# Patient Record
Sex: Male | Born: 1985 | Race: Black or African American | Hispanic: No | Marital: Single | Smoking: Current every day smoker
Health system: Southern US, Community
[De-identification: ages and names within clinical notes are randomized; demographics above are authoritative.]

## PROBLEM LIST (undated history)

## (undated) ENCOUNTER — Emergency Department: Discharge status: Absent without leave | Payer: Self-pay

## (undated) DIAGNOSIS — B20 Human immunodeficiency virus [HIV] disease: Secondary | ICD-10-CM

## (undated) DIAGNOSIS — Z21 Asymptomatic human immunodeficiency virus [HIV] infection status: Secondary | ICD-10-CM

## (undated) HISTORY — PX: OTHER SURGICAL HISTORY: SHX169

---

## 2011-06-28 ENCOUNTER — Emergency Department (HOSPITAL_COMMUNITY): Payer: Self-pay

## 2011-06-28 ENCOUNTER — Inpatient Hospital Stay (HOSPITAL_COMMUNITY)
Admission: EM | Admit: 2011-06-28 | Discharge: 2011-06-30 | DRG: 392 | Disposition: A | Discharge status: Home / Self Care | Payer: Self-pay | Attending: Internal Medicine | Admitting: Internal Medicine

## 2011-06-28 DIAGNOSIS — K921 Melena: Secondary | ICD-10-CM | POA: Diagnosis present

## 2011-06-28 DIAGNOSIS — Z21 Asymptomatic human immunodeficiency virus [HIV] infection status: Secondary | ICD-10-CM | POA: Diagnosis present

## 2011-06-28 DIAGNOSIS — R109 Unspecified abdominal pain: Principal | ICD-10-CM | POA: Diagnosis present

## 2011-06-28 DIAGNOSIS — F17200 Nicotine dependence, unspecified, uncomplicated: Secondary | ICD-10-CM | POA: Diagnosis present

## 2011-06-28 LAB — CBC
Hemoglobin: 16.2 g/dL (ref 13.0–17.0)
MCHC: 35.8 g/dL (ref 30.0–36.0)
Platelets: 268 10*3/uL (ref 150–400)

## 2011-06-28 LAB — URINALYSIS, ROUTINE W REFLEX MICROSCOPIC
Glucose, UA: NEGATIVE mg/dL
Leukocytes, UA: NEGATIVE
Nitrite: NEGATIVE
Protein, ur: NEGATIVE mg/dL
Urobilinogen, UA: 1 mg/dL (ref 0.0–1.0)

## 2011-06-28 LAB — DIFFERENTIAL
Basophils Absolute: 0 10*3/uL (ref 0.0–0.1)
Basophils Relative: 0 % (ref 0–1)
Eosinophils Absolute: 0.1 10*3/uL (ref 0.0–0.7)
Monocytes Absolute: 0.4 10*3/uL (ref 0.1–1.0)
Neutro Abs: 5 10*3/uL (ref 1.7–7.7)
Neutrophils Relative %: 69 % (ref 43–77)

## 2011-06-28 LAB — COMPREHENSIVE METABOLIC PANEL
ALT: 17 U/L (ref 0–53)
AST: 18 U/L (ref 0–37)
Albumin: 4.3 g/dL (ref 3.5–5.2)
CO2: 33 mEq/L — ABNORMAL HIGH (ref 19–32)
Calcium: 10.4 mg/dL (ref 8.4–10.5)
Creatinine, Ser: 0.84 mg/dL (ref 0.50–1.35)
Sodium: 141 mEq/L (ref 135–145)
Total Protein: 8.3 g/dL (ref 6.0–8.3)

## 2011-06-29 ENCOUNTER — Encounter (HOSPITAL_COMMUNITY): Payer: Self-pay | Admitting: Radiology

## 2011-06-29 ENCOUNTER — Observation Stay (HOSPITAL_COMMUNITY): Payer: Self-pay

## 2011-06-29 LAB — BASIC METABOLIC PANEL
BUN: 6 mg/dL (ref 6–23)
CO2: 27 mEq/L (ref 19–32)
Chloride: 105 mEq/L (ref 96–112)
Creatinine, Ser: 0.76 mg/dL (ref 0.50–1.35)
Potassium: 3.7 mEq/L (ref 3.5–5.1)

## 2011-06-29 LAB — SEDIMENTATION RATE: Sed Rate: 2 mm/hr (ref 0–16)

## 2011-06-29 LAB — T-HELPER CELLS (CD4) COUNT (NOT AT ARMC)
CD4 % Helper T Cell: 31 % — ABNORMAL LOW (ref 33–55)
CD4 T Cell Abs: 620 uL (ref 400–2700)

## 2011-06-29 LAB — CBC
HCT: 39.8 % (ref 39.0–52.0)
Hemoglobin: 14.2 g/dL (ref 13.0–17.0)
MCH: 32.6 pg (ref 26.0–34.0)
MCV: 91.3 fL (ref 78.0–100.0)
RBC: 4.36 MIL/uL (ref 4.22–5.81)
WBC: 5.5 10*3/uL (ref 4.0–10.5)

## 2011-06-29 MED ORDER — IOHEXOL 300 MG/ML  SOLN
100.0000 mL | Freq: Once | INTRAMUSCULAR | Status: AC | PRN
  Administered 2011-06-29: 100 mL via INTRAVENOUS

## 2011-06-30 LAB — GC/CHLAMYDIA PROBE AMP, URINE: Chlamydia, Swab/Urine, PCR: NEGATIVE

## 2011-07-01 LAB — HIV-1 RNA QUANT-NO REFLEX-BLD: HIV 1 RNA Quant: 168 copies/mL — ABNORMAL HIGH (ref <20)

## 2011-07-13 NOTE — Discharge Summary (Signed)
  Paul Palmer, Paul Palmer           ACCOUNT NO.:  [id number]  MEDICAL RECORD NO.:  [id number]  LOCATION:  1337                         FACILITY:  [facility]  PHYSICIAN:  Kathlen Mody, MD       DATE OF BIRTH:  1986/05/22  DATE OF ADMISSION:  06/28/2011 DATE OF DISCHARGE:  06/30/2011                              DISCHARGE SUMMARY   DISCHARGE DIAGNOSES: 1. Human immunodeficiency virus. 2. Nausea, abdominal pain. 3. Tobacco abuse.  DISCHARGE MEDICATIONS:  Nicotine patch.  CONSULTATIONS:  Phone consult from Dr. Lucianne Muss from Infectious Disease.  PERTINENT LABS:  The patient had a CBC done which was within normal limits.  Urinalysis is negative for nitrites and leukocytes. Comprehensive metabolic, significant for bicarb of 33, lipase within normal limits, amylase within normal limits.  ESR was 2.  CD-4 count was 620.  T-helper percentage was 31.  Chlamydia and GC probe was negative. HIV RNA was 168 and C-reactive protein was more than 0.6. DIAGNOSTIC STUDIES:  The patient had a CT abdomen and pelvis done which showed a normal appendix, tiny amount of perihepatic ascites, very small amount of ascites in the pelvis with the exact etiology not identified, negative for any free intraperitoneal air.  BRIEF HOSPITAL COURSE:  This 25 year old gentleman who was recently transferred to [facility], has a history of HIV, has not been on treatment for more than 6 months was initially being treated at [place].  He was admitted for abdominal pain associated with some nausea, vomiting, and diarrhea.  He was given IV fluids and IV Zofran for his nausea.  Over the course of the next 24 hours, his symptoms have improved. 1. HIV:  The patient has not been on a HAART therapy for more than six     months as he has recently transferred to [facility].  Infectious     Disease consult was obtained over the phone who recommended to be     discharged home and to follow up with ID Clinic the next day.  Infectious Disease office was called and a follow up appointment     was scheduled for the next day. 2. Tobacco abuse:  Tobacco cessation counseling given and nicotine     patch prescription was given to him.  On the day of discharge, the     patient's vitals were within normal limits.  His exam was within     normal limits.  He was discharged home to follow up with ID Clinic     for his HIV.         ______________________________ Kathlen Mody, MD    VA/MEDQ  D:  07/12/2011  T:  07/13/2011  Job:  [id number]  Electronically Signed by Kathlen Mody MD on 07/13/2011 02:07:39 PM

## 2011-07-23 ENCOUNTER — Ambulatory Visit (INDEPENDENT_AMBULATORY_CARE_PROVIDER_SITE_OTHER): Payer: Self-pay | Admitting: Internal Medicine

## 2011-07-23 ENCOUNTER — Encounter: Payer: Self-pay | Admitting: Internal Medicine

## 2011-07-23 VITALS — BP 118/76 | HR 76 | Temp 98.2°F | Ht 74.0 in | Wt 171.0 lb

## 2011-07-23 DIAGNOSIS — Z113 Encounter for screening for infections with a predominantly sexual mode of transmission: Secondary | ICD-10-CM

## 2011-07-23 DIAGNOSIS — B20 Human immunodeficiency virus [HIV] disease: Secondary | ICD-10-CM | POA: Insufficient documentation

## 2011-07-23 MED ORDER — EFAVIRENZ-EMTRICITAB-TENOFOVIR 600-200-300 MG PO TABS: 1.0000 | ORAL_TABLET | Freq: Every day | ORAL | Status: DC

## 2011-07-23 NOTE — Progress Notes (Signed)
  Subjective:    Patient ID: Paul Palmer, male    DOB: 08-31-1986, 25 y.o.   MRN: [id number]  HPI this is a new patient here to our clinic for treatment of HIV. He previously was treated in [place] through their [facility] [facility] clinic and was started approximately one year ago on Atripla. He states that he took the medicine well not missing any doses during that time however he had run out of medicine after moving to [facility] and had not looked into continuing the medication through a local clinic until he had already run out. Unfortunately therefore he has been out of his medicines since July. She was recently hospitalized briefly for4 gastrointestinal symptoms that have since resolved. His CD4 T-cell count at that time was 620 and his viral load was detectable at 168 copies.  He tells me in the past he has never had any opportunistic infections, no history of gonorrhea, no history of Chlamydia, no history of syphilis. He has never been hospitalized for complications of HIV. He tells me his CD4 nadir was approximately 400. He otherwise has no other medical problems and takes no other medications. The Atripla he took with some occasional fatigue however he felt that the medicine was tolerable and hopes to take that again. He does tell me he's had all of his appropriate vaccines though no records are available yet today.    Review of Systems  All other systems reviewed and are negative.       Objective:   Physical Exam  Constitutional: He is oriented to person, place, and time. He appears well-developed and well-nourished.  HENT:  Mouth/Throat: No oropharyngeal exudate.  Eyes: No scleral icterus.  Cardiovascular: Normal rate, regular rhythm and normal heart sounds.   No murmur heard. Pulmonary/Chest: Effort normal and breath sounds normal. He has no wheezes.  Abdominal: Soft. Bowel sounds are normal. There is no tenderness.  Lymphadenopathy:    He has no cervical adenopathy.    Neurological: He is alert and oriented to person, place, and time.  Skin: Skin is warm and dry. No erythema.  Psychiatric: He has a normal mood and affect. His behavior is normal.          Assessment & Plan:

## 2011-07-23 NOTE — Assessment & Plan Note (Signed)
Today he will be signed up for ADAP and once that is in place he will be restarted on his Atripla. I did discuss with him the concern for resistance particularly since he had previously stopped. I did tell him that with this will be followed closely however he will restart the Atripla and have the viral load and T-cell count checked one month following initiation of therapy. In addition I will get previous records to include recent labs as well as his vaccine history. He did state that he had his flu shot already and all his vaccines are otherwise up to date. He did receive condoms and I did encourage condom use for prevention. I also discussed the long-term effects of HIV on his kidneys and heart and encouraged healthy diet as well as exercise. He does tell me he is very active and takes good care of himself.

## 2011-07-27 ENCOUNTER — Ambulatory Visit: Payer: Self-pay
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
## 2011-11-19 ENCOUNTER — Encounter (HOSPITAL_COMMUNITY): Payer: Self-pay | Admitting: Family Medicine

## 2011-11-19 ENCOUNTER — Emergency Department (HOSPITAL_COMMUNITY): Payer: No Typology Code available for payment source

## 2011-11-19 ENCOUNTER — Emergency Department (HOSPITAL_COMMUNITY)
Admission: EM | Admit: 2011-11-19 | Discharge: 2011-11-19 | Disposition: A | Discharge status: Home / Self Care | Payer: No Typology Code available for payment source | Attending: Emergency Medicine | Admitting: Emergency Medicine

## 2011-11-19 DIAGNOSIS — S139XXA Sprain of joints and ligaments of unspecified parts of neck, initial encounter: Secondary | ICD-10-CM | POA: Insufficient documentation

## 2011-11-19 DIAGNOSIS — Z21 Asymptomatic human immunodeficiency virus [HIV] infection status: Secondary | ICD-10-CM | POA: Insufficient documentation

## 2011-11-19 DIAGNOSIS — R51 Headache: Secondary | ICD-10-CM | POA: Insufficient documentation

## 2011-11-19 DIAGNOSIS — Y92410 Unspecified street and highway as the place of occurrence of the external cause: Secondary | ICD-10-CM | POA: Insufficient documentation

## 2011-11-19 DIAGNOSIS — Y998 Other external cause status: Secondary | ICD-10-CM | POA: Insufficient documentation

## 2011-11-19 DIAGNOSIS — IMO0002 Reserved for concepts with insufficient information to code with codable children: Secondary | ICD-10-CM | POA: Insufficient documentation

## 2011-11-19 DIAGNOSIS — F17200 Nicotine dependence, unspecified, uncomplicated: Secondary | ICD-10-CM | POA: Insufficient documentation

## 2011-11-19 DIAGNOSIS — M542 Cervicalgia: Secondary | ICD-10-CM | POA: Insufficient documentation

## 2011-11-19 DIAGNOSIS — S8000XA Contusion of unspecified knee, initial encounter: Secondary | ICD-10-CM | POA: Insufficient documentation

## 2011-11-19 DIAGNOSIS — Z79899 Other long term (current) drug therapy: Secondary | ICD-10-CM | POA: Insufficient documentation

## 2011-11-19 HISTORY — DX: Human immunodeficiency virus (HIV) disease: B20

## 2011-11-19 HISTORY — DX: Asymptomatic human immunodeficiency virus (hiv) infection status: Z21

## 2011-11-19 MED ORDER — SODIUM CHLORIDE 0.9 % IV SOLN
Freq: Once | INTRAVENOUS | Status: AC
  Administered 2011-11-19: 21:00:00 via INTRAVENOUS

## 2011-11-19 MED ORDER — ONDANSETRON HCL 4 MG/2ML IJ SOLN
4.0000 mg | Freq: Once | INTRAMUSCULAR | Status: AC
  Administered 2011-11-19: 4 mg via INTRAVENOUS
  Filled 2011-11-19: qty 2

## 2011-11-19 MED ORDER — HYDROMORPHONE HCL PF 1 MG/ML IJ SOLN
1.0000 mg | Freq: Once | INTRAMUSCULAR | Status: AC
  Administered 2011-11-19: 1 mg via INTRAVENOUS
  Filled 2011-11-19: qty 1

## 2011-11-19 MED ORDER — HYDROCODONE-ACETAMINOPHEN 5-500 MG PO TABS: 1.0000 | ORAL_TABLET | Freq: Four times a day (QID) | ORAL | Status: AC | PRN

## 2011-11-19 MED ORDER — IBUPROFEN 600 MG PO TABS: 600.0000 mg | ORAL_TABLET | Freq: Four times a day (QID) | ORAL | Status: AC | PRN
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
## 2011-11-19 NOTE — ED Notes (Signed)
Patient transported to X-ray 

## 2011-11-19 NOTE — ED Notes (Signed)
C-collar removed after confirming with Manus Rudd, FNP.  Patient's face cleaned with Safe Cleanse Wound Cleaner.  Patient tolerated well. 2 lacerations, approx 1/2 cm long noted to left cheek; bleeding controlled. Superficial abrasion noted above left eye brow.

## 2011-11-19 NOTE — ED Notes (Signed)
Patient transported to CT 

## 2011-11-19 NOTE — ED Notes (Signed)
Returned from xray

## 2011-11-19 NOTE — ED Notes (Signed)
Gail S, NP at bedside  
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
## 2011-11-20 NOTE — ED Provider Notes (Signed)
Medical screening examination/treatment/procedure(s) were performed by non-physician practitioner and as supervising physician I was immediately available for consultation/collaboration.  Monteen Toops P Aune Adami, MD 11/20/11 2207 

## 2011-11-25 ENCOUNTER — Telehealth: Payer: Self-pay | Admitting: *Deleted
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
## 2011-11-30 ENCOUNTER — Other Ambulatory Visit: Payer: No Typology Code available for payment source

## 2011-11-30 ENCOUNTER — Telehealth: Payer: Self-pay | Admitting: *Deleted

## 2011-11-30 NOTE — Telephone Encounter (Signed)
Called patient and rescheduled lab appointment. Wendall Mola CMA

## 2011-12-02 ENCOUNTER — Other Ambulatory Visit: Payer: No Typology Code available for payment source

## 2011-12-03 ENCOUNTER — Other Ambulatory Visit (INDEPENDENT_AMBULATORY_CARE_PROVIDER_SITE_OTHER): Payer: No Typology Code available for payment source

## 2011-12-03 DIAGNOSIS — B20 Human immunodeficiency virus [HIV] disease: Secondary | ICD-10-CM

## 2011-12-03 DIAGNOSIS — Z113 Encounter for screening for infections with a predominantly sexual mode of transmission: Secondary | ICD-10-CM

## 2011-12-03 LAB — COMPLETE METABOLIC PANEL WITH GFR
Alkaline Phosphatase: 69 U/L (ref 39–117)
BUN: 9 mg/dL (ref 6–23)
CO2: 24 mEq/L (ref 19–32)
Creat: 0.98 mg/dL (ref 0.50–1.35)
GFR, Est African American: >89 mL/min
GFR, Est Non African American: >89 mL/min
Glucose, Bld: 82 mg/dL (ref 70–99)
Sodium: 140 mEq/L (ref 135–145)
Total Bilirubin: 0.7 mg/dL (ref 0.3–1.2)

## 2011-12-03 LAB — CBC WITH DIFFERENTIAL/PLATELET
Basophils Relative: 1 % (ref 0–1)
HCT: 45.1 % (ref 39.0–52.0)
Hemoglobin: 16.1 g/dL (ref 13.0–17.0)
Lymphocytes Relative: 35 % (ref 12–46)
MCHC: 35.7 g/dL (ref 30.0–36.0)
Monocytes Absolute: 0.4 10*3/uL (ref 0.1–1.0)
Monocytes Relative: 9 % (ref 3–12)
Neutro Abs: 2.1 10*3/uL (ref 1.7–7.7)
Neutrophils Relative %: 50 % (ref 43–77)
RBC: 4.98 MIL/uL (ref 4.22–5.81)
WBC: 4.1 10*3/uL (ref 4.0–10.5)

## 2011-12-03 LAB — RPR

## 2011-12-04 LAB — T-HELPER CELL (CD4) - (RCID CLINIC ONLY)
CD4 % Helper T Cell: 26 % — ABNORMAL LOW (ref 33–55)
CD4 T Cell Abs: 410 uL (ref 400–2700)

## 2011-12-07 LAB — HIV-1 RNA ULTRAQUANT REFLEX TO GENTYP+
HIV 1 RNA Quant: 2714 copies/mL — ABNORMAL HIGH (ref <20)
HIV-1 RNA Quant, Log: 3.43 {Log} — ABNORMAL HIGH (ref <1.30)

## 2011-12-15 LAB — HIV-1 GENOTYPR PLUS

## 2011-12-21 ENCOUNTER — Ambulatory Visit: Payer: No Typology Code available for payment source | Admitting: Internal Medicine

## 2011-12-24 ENCOUNTER — Encounter: Payer: Self-pay | Admitting: Internal Medicine

## 2011-12-24 ENCOUNTER — Ambulatory Visit (INDEPENDENT_AMBULATORY_CARE_PROVIDER_SITE_OTHER): Payer: Self-pay | Admitting: Internal Medicine

## 2011-12-24 ENCOUNTER — Ambulatory Visit: Payer: Self-pay | Admitting: Internal Medicine

## 2011-12-24 VITALS — BP 128/76 | HR 87 | Temp 98.1°F | Ht 75.0 in | Wt 170.5 lb

## 2011-12-24 DIAGNOSIS — Z23 Encounter for immunization: Secondary | ICD-10-CM

## 2011-12-24 DIAGNOSIS — B20 Human immunodeficiency virus [HIV] disease: Secondary | ICD-10-CM

## 2011-12-24 MED ORDER — EFAVIRENZ-EMTRICITAB-TENOFOVIR 600-200-300 MG PO TABS: 1.0000 | ORAL_TABLET | Freq: Every day | ORAL | Status: DC

## 2011-12-24 NOTE — Patient Instructions (Signed)
Get your labs about 3 weeks after you restart the Atripla.
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
## 2011-12-24 NOTE — Assessment & Plan Note (Signed)
He will be scheduled to sign up for ADAP.  He does not have appropriate paperwork today so will need to return.  Once established, he will restart Atripla and have repeat labs 3 weeks after starting and see me back 2 weeks after labs.  He was reminded to use condoms with all sexual activity.

## 2012-01-05 ENCOUNTER — Ambulatory Visit: Payer: Self-pay

## 2012-01-12 ENCOUNTER — Ambulatory Visit: Payer: Self-pay

## 2012-01-18 ENCOUNTER — Ambulatory Visit: Payer: Self-pay

## 2012-02-08 ENCOUNTER — Other Ambulatory Visit: Payer: Self-pay | Admitting: *Deleted

## 2012-02-08 DIAGNOSIS — B20 Human immunodeficiency virus [HIV] disease: Secondary | ICD-10-CM

## 2012-02-08 MED ORDER — EFAVIRENZ-EMTRICITAB-TENOFOVIR 600-200-300 MG PO TABS: 1.0000 | ORAL_TABLET | Freq: Every day | ORAL | Status: DC

## 2012-03-14 ENCOUNTER — Other Ambulatory Visit: Payer: Self-pay

## 2012-03-17 ENCOUNTER — Other Ambulatory Visit (INDEPENDENT_AMBULATORY_CARE_PROVIDER_SITE_OTHER): Payer: Self-pay

## 2012-03-17 DIAGNOSIS — B20 Human immunodeficiency virus [HIV] disease: Secondary | ICD-10-CM

## 2012-03-17 LAB — COMPREHENSIVE METABOLIC PANEL
ALT: 19 U/L (ref 0–53)
Albumin: 4 g/dL (ref 3.5–5.2)
CO2: 28 mEq/L (ref 19–32)
Calcium: 8.9 mg/dL (ref 8.4–10.5)
Chloride: 104 mEq/L (ref 96–112)
Glucose, Bld: 83 mg/dL (ref 70–99)
Sodium: 140 mEq/L (ref 135–145)
Total Protein: 7 g/dL (ref 6.0–8.3)

## 2012-03-18 LAB — CBC WITH DIFFERENTIAL/PLATELET
Hemoglobin: 15.6 g/dL (ref 13.0–17.0)
Lymphocytes Relative: 46 % (ref 12–46)
Lymphs Abs: 1.8 10*3/uL (ref 0.7–4.0)
MCV: 89 fL (ref 78.0–100.0)
Monocytes Relative: 8 % (ref 3–12)
Neutrophils Relative %: 42 % — ABNORMAL LOW (ref 43–77)
Platelets: 290 10*3/uL (ref 150–400)
RBC: 4.92 MIL/uL (ref 4.22–5.81)
WBC: 3.8 10*3/uL — ABNORMAL LOW (ref 4.0–10.5)

## 2012-03-18 LAB — T-HELPER CELL (CD4) - (RCID CLINIC ONLY): CD4 T Cell Abs: 480 uL (ref 400–2700)

## 2012-03-22 LAB — HIV-1 RNA ULTRAQUANT REFLEX TO GENTYP+: HIV 1 RNA Quant: 26 copies/mL — ABNORMAL HIGH (ref <20)

## 2012-03-29 ENCOUNTER — Ambulatory Visit (INDEPENDENT_AMBULATORY_CARE_PROVIDER_SITE_OTHER): Payer: Self-pay | Admitting: Internal Medicine

## 2012-03-29 ENCOUNTER — Encounter: Payer: Self-pay | Admitting: Internal Medicine

## 2012-03-29 VITALS — BP 125/85 | HR 79 | Temp 98.3°F | Ht 76.0 in | Wt 170.0 lb

## 2012-03-29 DIAGNOSIS — Z79899 Other long term (current) drug therapy: Secondary | ICD-10-CM

## 2012-03-29 DIAGNOSIS — B20 Human immunodeficiency virus [HIV] disease: Secondary | ICD-10-CM

## 2012-03-29 DIAGNOSIS — R350 Frequency of micturition: Secondary | ICD-10-CM | POA: Insufficient documentation

## 2012-03-29 DIAGNOSIS — Z113 Encounter for screening for infections with a predominantly sexual mode of transmission: Secondary | ICD-10-CM

## 2012-03-29 NOTE — Assessment & Plan Note (Signed)
Will check a UA today 

## 2012-03-29 NOTE — Assessment & Plan Note (Signed)
Doing well and has excellent compliance.  Reminded to use condoms with all sexual activity.  RTC in 4 months.

## 2012-03-29 NOTE — Progress Notes (Signed)
  Subjective:    Patient ID: Paul Palmer, male    DOB: 01/31/1986, 26 y.o.   MRN: [id number]  HPI Here for follow up of his HIV.  Is back on Atripla after stopping for several months and losing ADAP while in [place].  He restarted and reports excellent adherence and no issues.  Denies depression and is pleased with his regimen.   Also complaint of urinary frequency. No discharge, no fever, no chills.  Brief hematuria.    Review of Systems  Constitutional: Negative.   HENT: Negative for sore throat and trouble swallowing.   Respiratory: Negative for cough and shortness of breath.   Cardiovascular: Negative for chest pain, palpitations and leg swelling.  Gastrointestinal: Negative for nausea, vomiting, abdominal pain and diarrhea.  Musculoskeletal: Negative for myalgias, joint swelling and arthralgias.  Skin: Negative.   Psychiatric/Behavioral: Negative for dysphoric mood. The patient is not nervous/anxious.        Objective:   Physical Exam  Constitutional: He appears well-developed and well-nourished. No distress.  HENT:  Mouth/Throat: Oropharynx is clear and moist. No oropharyngeal exudate.  Cardiovascular: Normal rate, regular rhythm and normal heart sounds.  Exam reveals no gallop and no friction rub.   No murmur heard. Pulmonary/Chest: Effort normal and breath sounds normal. No respiratory distress. He has no wheezes. He has no rales.  Abdominal: Soft. Bowel sounds are normal. He exhibits no distension. There is no tenderness. There is no rebound.  Psychiatric: He has a normal mood and affect. His behavior is normal.          Assessment & Plan:

## 2012-03-30 ENCOUNTER — Encounter: Payer: Self-pay | Admitting: Internal Medicine

## 2012-03-30 ENCOUNTER — Telehealth: Payer: Self-pay | Admitting: *Deleted

## 2012-03-30 ENCOUNTER — Ambulatory Visit (INDEPENDENT_AMBULATORY_CARE_PROVIDER_SITE_OTHER): Payer: Self-pay | Admitting: Internal Medicine

## 2012-03-30 VITALS — BP 130/78 | HR 108 | Temp 99.2°F | Ht 76.0 in | Wt 171.0 lb

## 2012-03-30 DIAGNOSIS — R350 Frequency of micturition: Secondary | ICD-10-CM

## 2012-03-30 DIAGNOSIS — R369 Urethral discharge, unspecified: Secondary | ICD-10-CM

## 2012-03-30 LAB — URINALYSIS, MICROSCOPIC ONLY: Casts: NONE SEEN

## 2012-03-30 LAB — URINALYSIS, ROUTINE W REFLEX MICROSCOPIC
Bilirubin Urine: NEGATIVE
Hgb urine dipstick: NEGATIVE
Ketones, ur: NEGATIVE mg/dL
Protein, ur: NEGATIVE mg/dL
Specific Gravity, Urine: 1.021 (ref 1.005–1.030)
Urobilinogen, UA: 1 mg/dL (ref 0.0–1.0)

## 2012-03-30 LAB — URINE CULTURE: Organism ID, Bacteria: NO GROWTH

## 2012-03-30 MED ORDER — CEFTRIAXONE SODIUM 1 G IJ SOLR
250.0000 mg | Freq: Once | INTRAMUSCULAR | Status: AC
  Administered 2012-03-30: 250 mg via INTRAMUSCULAR

## 2012-03-30 MED ORDER — CEFTRIAXONE SODIUM 250 MG IJ SOLR: 250.0000 mg | Freq: Once | INTRAMUSCULAR | Status: AC

## 2012-03-30 MED ORDER — DOXYCYCLINE HYCLATE 100 MG PO TABS: 100.0000 mg | ORAL_TABLET | Freq: Two times a day (BID) | ORAL | Status: AC

## 2012-03-30 NOTE — Progress Notes (Signed)
  Subjective:    Patient ID: Paul Palmer, male    DOB: 01-30-1986, 26 y.o.   MRN: [id number]  HPI He comes in today for a work in visit. He was seen yesterday for his routine HIV visit and gave symptoms of urinary frequency but at that time denied any discharge. A urinalysis was sent and was negative for bacteria though there was some WBCs. He comes in today concerned that the urine is still an issue. He does tell me today that he has had some white discharge. No fever no chills. He has had this before but he thought at that time that it was a bacterial infection, non-gonococcal or chlamydia.   Review of Systems  Constitutional: Negative for fever, chills and unexpected weight change.  Genitourinary: Positive for discharge. Negative for penile swelling, scrotal swelling, genital sores and penile pain.  Hematological: Negative for adenopathy.       Objective:   Physical Exam  Genitourinary: Penis normal. No penile tenderness.  Skin: Skin is warm and dry. No rash noted.          Assessment & Plan:

## 2012-03-30 NOTE — Assessment & Plan Note (Signed)
We'll treat him today for gonorrhea and chlamydia. Will I will also send off the urine GC and chlamydia. If this persists, he will return otherwise he will return for his usual visit

## 2012-03-30 NOTE — Telephone Encounter (Signed)
Message copied by Macy Mis on Wed Mar 30, 2012 10:19 AM ------      Message from: Gardiner Barefoot      Created: Wed Mar 30, 2012  9:16 AM       Let him know that his UA is negative for a UTI.  Thanks

## 2012-03-30 NOTE — Telephone Encounter (Signed)
Can we add on a GC/chlamydia?  Or have him do one?

## 2012-03-30 NOTE — Telephone Encounter (Signed)
Patient notified of lab results, said he is still having issues and requested another appointment.  Sent to front desk to schedule appointment. Wendall Mola CMA

## 2012-03-31 ENCOUNTER — Other Ambulatory Visit: Payer: Self-pay | Admitting: Internal Medicine

## 2012-04-01 ENCOUNTER — Telehealth: Payer: Self-pay | Admitting: *Deleted

## 2012-04-01 NOTE — Telephone Encounter (Signed)
Called patient and left a voicemail regarding lab results.  He has Chlamydia and Ghonorrea, he was already given an injection of Rocephin, he needs to pick up oral antibiotic and use condoms. Wendall Mola

## 2012-04-01 NOTE — Telephone Encounter (Signed)
Message copied by Macy Mis on Fri Apr 01, 2012 12:22 PM ------      Message from: Gardiner Barefoot      Created: Thu Mar 31, 2012  7:18 PM       Never mind, he got treatment, let him know of the results and to use a condom.

## 2012-07-11 ENCOUNTER — Other Ambulatory Visit: Payer: Self-pay

## 2012-07-11 DIAGNOSIS — Z79899 Other long term (current) drug therapy: Secondary | ICD-10-CM

## 2012-07-11 DIAGNOSIS — Z113 Encounter for screening for infections with a predominantly sexual mode of transmission: Secondary | ICD-10-CM

## 2012-07-11 DIAGNOSIS — B20 Human immunodeficiency virus [HIV] disease: Secondary | ICD-10-CM

## 2012-07-11 LAB — CBC WITH DIFFERENTIAL/PLATELET
Eosinophils Relative: 5 % (ref 0–5)
HCT: 45.2 % (ref 39.0–52.0)
Hemoglobin: 16.1 g/dL (ref 13.0–17.0)
Lymphocytes Relative: 49 % — ABNORMAL HIGH (ref 12–46)
Lymphs Abs: 2.1 10*3/uL (ref 0.7–4.0)
MCV: 93 fL (ref 78.0–100.0)
Monocytes Absolute: 0.3 10*3/uL (ref 0.1–1.0)
Monocytes Relative: 8 % (ref 3–12)
Neutro Abs: 1.6 10*3/uL — ABNORMAL LOW (ref 1.7–7.7)
WBC: 4.3 10*3/uL (ref 4.0–10.5)

## 2012-07-12 ENCOUNTER — Other Ambulatory Visit: Payer: Self-pay

## 2012-07-12 LAB — COMPREHENSIVE METABOLIC PANEL
AST: 22 U/L (ref 0–37)
Albumin: 4.4 g/dL (ref 3.5–5.2)
BUN: 8 mg/dL (ref 6–23)
CO2: 28 mEq/L (ref 19–32)
Calcium: 9.5 mg/dL (ref 8.4–10.5)
Chloride: 104 mEq/L (ref 96–112)
Glucose, Bld: 87 mg/dL (ref 70–99)
Potassium: 4.5 mEq/L (ref 3.5–5.3)

## 2012-07-12 LAB — RPR

## 2012-07-12 LAB — LIPID PANEL: Total CHOL/HDL Ratio: 3.6 Ratio

## 2012-07-13 LAB — T-HELPER CELL (CD4) - (RCID CLINIC ONLY)
CD4 % Helper T Cell: 29 % — ABNORMAL LOW (ref 33–55)
CD4 T Cell Abs: 630 uL (ref 400–2700)

## 2012-07-13 LAB — HIV-1 RNA QUANT-NO REFLEX-BLD
HIV 1 RNA Quant: <20 copies/mL (ref <20)
HIV-1 RNA Quant, Log: <1.3 {Log} (ref <1.30)

## 2012-07-25 ENCOUNTER — Ambulatory Visit: Payer: Self-pay

## 2012-07-28 ENCOUNTER — Ambulatory Visit: Payer: Self-pay | Admitting: Internal Medicine

## 2012-08-24 ENCOUNTER — Ambulatory Visit: Payer: Self-pay

## 2012-09-05 ENCOUNTER — Ambulatory Visit: Payer: Self-pay | Admitting: Internal Medicine

## 2012-09-06 ENCOUNTER — Encounter: Payer: Self-pay | Admitting: Internal Medicine

## 2012-09-06 ENCOUNTER — Ambulatory Visit (INDEPENDENT_AMBULATORY_CARE_PROVIDER_SITE_OTHER): Payer: Self-pay | Admitting: Internal Medicine

## 2012-09-06 VITALS — BP 138/81 | HR 74 | Temp 97.5°F | Ht 75.0 in | Wt 175.0 lb

## 2012-09-06 DIAGNOSIS — J309 Allergic rhinitis, unspecified: Secondary | ICD-10-CM

## 2012-09-06 DIAGNOSIS — Z23 Encounter for immunization: Secondary | ICD-10-CM

## 2012-09-06 DIAGNOSIS — J302 Other seasonal allergic rhinitis: Secondary | ICD-10-CM | POA: Insufficient documentation

## 2012-09-06 DIAGNOSIS — B20 Human immunodeficiency virus [HIV] disease: Secondary | ICD-10-CM

## 2012-09-06 NOTE — Progress Notes (Signed)
  Subjective:    Patient ID: Paul Palmer, male    DOB: 03/07/1986, 26 y.o.   MRN: [id number]  HPI He comes in for routine followup of his HIV. After previous noncompliance in [place] and lack of care for about a year, he returned and did start back on Atripla. He reports excellent compliance though did miss several doses do to not renewing ADAP in time.  He is pleased with the regimen and happy that his numbers are improving with now a normal CD4 count and undetectable viral load.   Review of Systems  Constitutional: Negative for fever, fatigue and unexpected weight change.  HENT: Negative for sore throat and trouble swallowing.   Respiratory: Negative for cough and shortness of breath.   Gastrointestinal: Negative for nausea, abdominal pain and diarrhea.  Neurological: Negative for dizziness and headaches.       Objective:   Physical Exam  Constitutional: He appears well-developed and well-nourished. No distress.  HENT:  Mouth/Throat: No oropharyngeal exudate.       Plus pharyngeal erythema consistent with postnasal drip  Cardiovascular: Normal rate and regular rhythm.  Exam reveals no gallop and no friction rub.   No murmur heard. Pulmonary/Chest: Effort normal and breath sounds normal. No respiratory distress. He has no wheezes. He has no rales.          Assessment & Plan:

## 2012-09-06 NOTE — Assessment & Plan Note (Signed)
He has sinus congestion and postnasal drip consistent with allergies. He will try over-the-counter allergies. He does get symptoms at this time each year

## 2012-09-06 NOTE — Assessment & Plan Note (Signed)
Is doing well with his regimen and pleased. He will continue and return in 6 months. He knows to call if he has any issues with his medication. I will check a hepatitis A antibody next visit to screen for vaccination. He is unsure of his past vaccination. His hepatitis B antibody is indeterminate.

## 2013-01-09 ENCOUNTER — Ambulatory Visit: Payer: Self-pay

## 2013-01-27 ENCOUNTER — Encounter: Payer: Self-pay | Admitting: *Deleted

## 2013-02-13 ENCOUNTER — Other Ambulatory Visit: Payer: Self-pay | Admitting: *Deleted

## 2013-02-13 DIAGNOSIS — B20 Human immunodeficiency virus [HIV] disease: Secondary | ICD-10-CM

## 2013-02-13 MED ORDER — EFAVIRENZ-EMTRICITAB-TENOFOVIR 600-200-300 MG PO TABS: 1.0000 | ORAL_TABLET | Freq: Every day | ORAL | Status: DC

## 2013-02-21 ENCOUNTER — Other Ambulatory Visit: Payer: Self-pay

## 2013-02-28 ENCOUNTER — Other Ambulatory Visit (INDEPENDENT_AMBULATORY_CARE_PROVIDER_SITE_OTHER): Payer: Self-pay

## 2013-02-28 ENCOUNTER — Other Ambulatory Visit: Payer: Self-pay | Admitting: Internal Medicine

## 2013-02-28 DIAGNOSIS — B20 Human immunodeficiency virus [HIV] disease: Secondary | ICD-10-CM

## 2013-03-01 LAB — HIV-1 RNA QUANT-NO REFLEX-BLD: HIV-1 RNA Quant, Log: <1.3 {Log} (ref <1.30)

## 2013-03-01 LAB — HEPATITIS A ANTIBODY, TOTAL: Hep A Total Ab: NEGATIVE

## 2013-03-07 ENCOUNTER — Encounter: Payer: Self-pay | Admitting: Internal Medicine

## 2013-03-07 ENCOUNTER — Ambulatory Visit (INDEPENDENT_AMBULATORY_CARE_PROVIDER_SITE_OTHER): Payer: Self-pay | Admitting: Internal Medicine

## 2013-03-07 VITALS — BP 124/76 | HR 75 | Temp 98.5°F | Ht 76.0 in | Wt 173.0 lb

## 2013-03-07 DIAGNOSIS — B20 Human immunodeficiency virus [HIV] disease: Secondary | ICD-10-CM

## 2013-03-07 DIAGNOSIS — Z23 Encounter for immunization: Secondary | ICD-10-CM

## 2013-03-07 MED ORDER — ELVITEG-COBIC-EMTRICIT-TENOFDF 150-150-200-300 MG PO TABS: 1.0000 | ORAL_TABLET | Freq: Every day | ORAL | Status: DC

## 2013-03-07 NOTE — Progress Notes (Signed)
  Subjective:    Patient ID: Paul Palmer, male    DOB: 30-May-1986, 27 y.o.   MRN: [id number]  HPI He comes in for followup of his HIV. He has been on Atripla and reports poor compliance with 1-2 missed doses every week. He does still get some dizziness from the medicine though does take it on an empty stomach. 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 He does feel that he is able to be compliant with his medications but with this particular medicine he is hesitant to take off times. He otherwise feels well with no weight loss, diarrhea or no new issues.   Review of Systems  Constitutional: Negative for chills, diaphoresis, activity change, appetite change, fatigue and unexpected weight change.  HENT: Negative for sore throat and trouble swallowing.   Respiratory: Negative for shortness of breath.   Gastrointestinal: Negative for nausea, abdominal pain and diarrhea.  Musculoskeletal: Negative for myalgias, joint swelling and arthralgias.  Skin: Negative for rash.  Neurological: Negative for dizziness, light-headedness and headaches.       Objective:   Physical Exam  Constitutional: He appears well-developed and well-nourished. No distress.  HENT:  Mouth/Throat: Oropharynx is clear and moist. No oropharyngeal exudate.  Cardiovascular: Normal rate, regular rhythm and normal heart sounds.  Exam reveals no gallop and no friction rub.   No murmur heard. Pulmonary/Chest: Effort normal and breath sounds normal. No respiratory distress. He has no wheezes. He has no rales.          Assessment & Plan:

## 2013-03-07 NOTE — Assessment & Plan Note (Signed)
He is doing well as far as his labs go however I am concerned with the development of resistance.  I did discuss different options and he does tell me that if he takes medicine during the day she will not miss any doses and will set his alarm. I will change him to Stribild. He will return in 3 weeks for labs and I will see him after that.  He will get hepatitis a vaccine #1 today.  I did discuss at length resistance and need to take it daily and he is aware of this importance. He is motivated to continue to take his medications. He will return in 4 weeks for followup.  Side effects of Stribild

## 2013-03-21 ENCOUNTER — Telehealth: Payer: Self-pay | Admitting: *Deleted

## 2013-03-21 ENCOUNTER — Other Ambulatory Visit: Payer: Self-pay | Admitting: Internal Medicine

## 2013-03-21 MED ORDER — ONDANSETRON HCL 4 MG PO TABS: 4.0000 mg | ORAL_TABLET | Freq: Three times a day (TID) | ORAL | Status: DC | PRN

## 2013-03-21 NOTE — Progress Notes (Signed)
Patient notified

## 2013-03-21 NOTE — Telephone Encounter (Signed)
Patient c/o nausea after taking Stribild, can anything be prescribed to help with this? Paul Palmer

## 2013-03-28 ENCOUNTER — Other Ambulatory Visit: Payer: Self-pay

## 2013-03-28 ENCOUNTER — Encounter: Payer: Self-pay | Admitting: *Deleted

## 2013-03-28 DIAGNOSIS — B20 Human immunodeficiency virus [HIV] disease: Secondary | ICD-10-CM

## 2013-03-28 LAB — CBC WITH DIFFERENTIAL/PLATELET
Eosinophils Absolute: 0.3 10*3/uL (ref 0.0–0.7)
Eosinophils Relative: 9 % — ABNORMAL HIGH (ref 0–5)
HCT: 41.1 % (ref 39.0–52.0)
Hemoglobin: 15.1 g/dL (ref 13.0–17.0)
Lymphocytes Relative: 49 % — ABNORMAL HIGH (ref 12–46)
Lymphs Abs: 1.7 10*3/uL (ref 0.7–4.0)
MCH: 33.3 pg (ref 26.0–34.0)
MCV: 90.7 fL (ref 78.0–100.0)
Monocytes Relative: 6 % (ref 3–12)
RBC: 4.53 MIL/uL (ref 4.22–5.81)

## 2013-03-28 LAB — COMPLETE METABOLIC PANEL WITH GFR
ALT: 19 U/L (ref 0–53)
AST: 17 U/L (ref 0–37)
Alkaline Phosphatase: 68 U/L (ref 39–117)
Chloride: 104 mEq/L (ref 96–112)
Creat: 1.02 mg/dL (ref 0.50–1.35)
Total Bilirubin: 0.6 mg/dL (ref 0.3–1.2)

## 2013-03-29 ENCOUNTER — Other Ambulatory Visit: Payer: Self-pay

## 2013-03-29 LAB — T-HELPER CELL (CD4) - (RCID CLINIC ONLY)
CD4 % Helper T Cell: 30 % — ABNORMAL LOW (ref 33–55)
CD4 T Cell Abs: 500 uL (ref 400–2700)

## 2013-04-04 ENCOUNTER — Ambulatory Visit (INDEPENDENT_AMBULATORY_CARE_PROVIDER_SITE_OTHER): Payer: Self-pay | Admitting: Internal Medicine

## 2013-04-04 ENCOUNTER — Encounter: Payer: Self-pay | Admitting: Internal Medicine

## 2013-04-04 VITALS — BP 137/82 | HR 73 | Temp 97.8°F | Ht 76.0 in | Wt 170.0 lb

## 2013-04-04 DIAGNOSIS — Z113 Encounter for screening for infections with a predominantly sexual mode of transmission: Secondary | ICD-10-CM

## 2013-04-04 DIAGNOSIS — B20 Human immunodeficiency virus [HIV] disease: Secondary | ICD-10-CM

## 2013-04-04 DIAGNOSIS — Z79899 Other long term (current) drug therapy: Secondary | ICD-10-CM

## 2013-04-04 NOTE — Progress Notes (Signed)
  Subjective:    Patient ID: Paul Palmer, male    DOB: 01-15-1986, 27 y.o.   MRN: [id number]  HPI Comes in for followup of HIV.  He was first seen last year after getting treatment in [place]. He had been on Atripla and that was restarted last year. He however had some missed doses mainly due to having to take it at night and forgetting. He was unable to take in the daytime when he would forget because of work and other issues. He was then switched by me to Stribild last visit. He comes in now one month after starting Stribild and his labs are continued good CD4 count and undetectable viral load. He did have one episode of nausea though no other problems since taking the medication with food. He is very pleased with the regimen and the decrease in side effects.   Review of Systems  Constitutional: Negative for fatigue.  Gastrointestinal: Negative for nausea and diarrhea.  Skin: Negative for rash.  Neurological: Negative for dizziness, light-headedness and headaches.       Objective:   Physical Exam  Constitutional: He appears well-developed and well-nourished. No distress.  Cardiovascular: Normal rate, regular rhythm and normal heart sounds.  Exam reveals no gallop and no friction rub.   No murmur heard. Pulmonary/Chest: Effort normal and breath sounds normal. No respiratory distress. He has no wheezes.  Lymphadenopathy:    He has no cervical adenopathy.          Assessment & Plan:

## 2013-04-04 NOTE — Assessment & Plan Note (Signed)
No new issues and he is doing well with his new medication. He will continue in follow up in 3 months for fasting labs.

## 2013-06-22 ENCOUNTER — Ambulatory Visit: Payer: Self-pay

## 2013-06-22 ENCOUNTER — Other Ambulatory Visit: Payer: Self-pay

## 2013-07-06 ENCOUNTER — Telehealth: Payer: Self-pay | Admitting: *Deleted

## 2013-07-06 ENCOUNTER — Ambulatory Visit: Payer: Self-pay | Admitting: Internal Medicine

## 2013-07-06 NOTE — Telephone Encounter (Signed)
Called patient to try to reschedule his appt, he no showed MD and lab appt. Phone is not in service. Paul Palmer

## 2013-08-02 ENCOUNTER — Other Ambulatory Visit: Payer: Self-pay | Admitting: *Deleted

## 2013-08-02 ENCOUNTER — Other Ambulatory Visit (INDEPENDENT_AMBULATORY_CARE_PROVIDER_SITE_OTHER): Payer: Self-pay

## 2013-08-02 DIAGNOSIS — Z79899 Other long term (current) drug therapy: Secondary | ICD-10-CM

## 2013-08-02 DIAGNOSIS — B20 Human immunodeficiency virus [HIV] disease: Secondary | ICD-10-CM

## 2013-08-02 DIAGNOSIS — Z113 Encounter for screening for infections with a predominantly sexual mode of transmission: Secondary | ICD-10-CM

## 2013-08-02 LAB — CBC WITH DIFFERENTIAL/PLATELET
Eosinophils Absolute: 0.3 10*3/uL (ref 0.0–0.7)
Hemoglobin: 15.6 g/dL (ref 13.0–17.0)
Lymphs Abs: 1.4 10*3/uL (ref 0.7–4.0)
MCH: 33.1 pg (ref 26.0–34.0)
MCV: 90.9 fL (ref 78.0–100.0)
Monocytes Relative: 7 % (ref 3–12)
Neutrophils Relative %: 50 % (ref 43–77)
RBC: 4.72 MIL/uL (ref 4.22–5.81)

## 2013-08-02 LAB — LIPID PANEL
Cholesterol: 146 mg/dL (ref 0–200)
HDL: 37 mg/dL — ABNORMAL LOW (ref >39)

## 2013-08-02 LAB — COMPLETE METABOLIC PANEL WITH GFR
ALT: 15 U/L (ref 0–53)
AST: 15 U/L (ref 0–37)
Alkaline Phosphatase: 66 U/L (ref 39–117)
Creat: 0.88 mg/dL (ref 0.50–1.35)
GFR, Est African American: >89 mL/min
Total Bilirubin: 0.5 mg/dL (ref 0.3–1.2)

## 2013-08-02 MED ORDER — ELVITEG-COBIC-EMTRICIT-TENOFDF 150-150-200-300 MG PO TABS: 1.0000 | ORAL_TABLET | Freq: Every day | ORAL | Status: DC

## 2013-08-03 LAB — HIV-1 RNA QUANT-NO REFLEX-BLD
HIV 1 RNA Quant: <20 copies/mL (ref <20)
HIV-1 RNA Quant, Log: <1.3 {Log} (ref <1.30)

## 2013-08-03 LAB — RPR

## 2013-08-28 ENCOUNTER — Other Ambulatory Visit: Payer: Self-pay | Admitting: *Deleted

## 2013-08-28 DIAGNOSIS — B20 Human immunodeficiency virus [HIV] disease: Secondary | ICD-10-CM

## 2013-08-28 MED ORDER — ELVITEG-COBIC-EMTRICIT-TENOFDF 150-150-200-300 MG PO TABS: 1.0000 | ORAL_TABLET | Freq: Every day | ORAL | Status: DC

## 2013-09-05 ENCOUNTER — Ambulatory Visit (INDEPENDENT_AMBULATORY_CARE_PROVIDER_SITE_OTHER): Payer: Self-pay | Admitting: Internal Medicine

## 2013-09-05 ENCOUNTER — Encounter: Payer: Self-pay | Admitting: Internal Medicine

## 2013-09-05 VITALS — BP 142/74 | HR 59 | Temp 98.3°F | Ht 75.0 in | Wt 172.0 lb

## 2013-09-05 DIAGNOSIS — J302 Other seasonal allergic rhinitis: Secondary | ICD-10-CM

## 2013-09-05 DIAGNOSIS — K006 Disturbances in tooth eruption: Secondary | ICD-10-CM

## 2013-09-05 DIAGNOSIS — Z23 Encounter for immunization: Secondary | ICD-10-CM

## 2013-09-05 DIAGNOSIS — J309 Allergic rhinitis, unspecified: Secondary | ICD-10-CM

## 2013-09-05 DIAGNOSIS — Z0000 Encounter for general adult medical examination without abnormal findings: Secondary | ICD-10-CM | POA: Insufficient documentation

## 2013-09-05 DIAGNOSIS — B20 Human immunodeficiency virus [HIV] disease: Secondary | ICD-10-CM

## 2013-09-05 DIAGNOSIS — K011 Impacted teeth: Secondary | ICD-10-CM

## 2013-09-05 NOTE — Progress Notes (Signed)
  Subjective:    Patient ID: Paul Palmer, male    DOB: 1986-04-24, 27 y.o.   MRN: [id number]  HPI  Comes in for followup of HIV.  He was first seen in 2012 after getting treatment in [place]. He had been on Atripla and that was restarted last year. He however had some missed doses mainly due to having to take it at night and forgetting. He was unable to take in the daytime when he would forget because of work and other issues. He was then switched by me to Stribild in 2013. He comes in for follow up.  He denies any missed doses and no new issues.  No diarrhea, no weight loss.  He has some tooth pain in right bottom. He is very pleased with the regimen and the decrease in side effects.   Review of Systems  Constitutional: Negative for fatigue.  HENT: Positive for postnasal drip. Negative for sore throat.   Eyes: Negative for visual disturbance.  Respiratory: Negative for cough and shortness of breath.   Cardiovascular: Negative for chest pain.  Gastrointestinal: Negative for nausea and diarrhea.  Musculoskeletal: Negative for arthralgias.  Skin: Negative for rash.  Neurological: Negative for dizziness, light-headedness and headaches.  Hematological: Negative for adenopathy.  Psychiatric/Behavioral: The patient is not nervous/anxious.        Objective:   Physical Exam  Constitutional: He appears well-developed and well-nourished. No distress.  HENT:  Mouth/Throat: No oropharyngeal exudate.  + streaking hemorrahge in throat c/w postnasal drip  Right bottom back molar area with no erythema, a growth that is concerning for wisdom tooth  Eyes: No scleral icterus.  Cardiovascular: Normal rate, regular rhythm and normal heart sounds.  Exam reveals no gallop and no friction rub.   No murmur heard. Pulmonary/Chest: Effort normal and breath sounds normal. No respiratory distress. He has no wheezes.  Abdominal: Soft. Bowel sounds are normal. He exhibits no distension.  Lymphadenopathy:     He has no cervical adenopathy.  Neurological: He is alert.  Skin: No rash noted.  Psychiatric: He has a normal mood and affect.          Assessment & Plan:

## 2013-09-05 NOTE — Assessment & Plan Note (Signed)
His cholesterol is good.

## 2013-09-05 NOTE — Assessment & Plan Note (Signed)
He is going to try Claritin and other over-the-counter medicines.

## 2013-09-05 NOTE — Assessment & Plan Note (Signed)
I think this is possibly a impacted wisdom tooth. He has been referred to dental

## 2013-09-05 NOTE — Assessment & Plan Note (Signed)
Is doing very well on his regimen and will return in 4 months.

## 2013-10-18 ENCOUNTER — Telehealth: Payer: Self-pay | Admitting: *Deleted

## 2013-10-18 NOTE — Telephone Encounter (Signed)
Requesting appt for itching dry skin, rash x 2 weeks on his penis.  Appt scheduled.

## 2013-10-19 ENCOUNTER — Encounter: Payer: Self-pay | Admitting: Internal Medicine

## 2013-10-19 ENCOUNTER — Ambulatory Visit (INDEPENDENT_AMBULATORY_CARE_PROVIDER_SITE_OTHER): Payer: Self-pay | Admitting: Internal Medicine

## 2013-10-19 VITALS — BP 141/80 | HR 79 | Temp 97.8°F | Ht 75.0 in | Wt 176.0 lb

## 2013-10-19 DIAGNOSIS — A539 Syphilis, unspecified: Secondary | ICD-10-CM

## 2013-10-19 DIAGNOSIS — A630 Anogenital (venereal) warts: Secondary | ICD-10-CM

## 2013-10-19 DIAGNOSIS — N4889 Other specified disorders of penis: Secondary | ICD-10-CM

## 2013-10-19 DIAGNOSIS — B977 Papillomavirus as the cause of diseases classified elsewhere: Secondary | ICD-10-CM

## 2013-10-19 DIAGNOSIS — N489 Disorder of penis, unspecified: Secondary | ICD-10-CM

## 2013-10-19 DIAGNOSIS — Z113 Encounter for screening for infections with a predominantly sexual mode of transmission: Secondary | ICD-10-CM

## 2013-10-19 LAB — RPR: RPR Ser Ql: REACTIVE — AB

## 2013-10-19 LAB — RPR TITER: RPR Titer: 1:32 {titer} — AB

## 2013-10-19 MED ORDER — IMIQUIMOD 5 % EX CREA: TOPICAL_CREAM | CUTANEOUS | Status: DC

## 2013-10-19 NOTE — Patient Instructions (Signed)
Try over the counter fungal rash cream (clotrimazole).

## 2013-10-20 ENCOUNTER — Telehealth: Payer: Self-pay | Admitting: *Deleted

## 2013-10-20 ENCOUNTER — Ambulatory Visit (INDEPENDENT_AMBULATORY_CARE_PROVIDER_SITE_OTHER): Payer: Self-pay | Admitting: *Deleted

## 2013-10-20 DIAGNOSIS — A5149 Other secondary syphilitic conditions: Secondary | ICD-10-CM | POA: Insufficient documentation

## 2013-10-20 DIAGNOSIS — A630 Anogenital (venereal) warts: Secondary | ICD-10-CM | POA: Insufficient documentation

## 2013-10-20 DIAGNOSIS — Z202 Contact with and (suspected) exposure to infections with a predominantly sexual mode of transmission: Secondary | ICD-10-CM

## 2013-10-20 LAB — T.PALLIDUM AB, TOTAL: T pallidum Antibodies (TP-PA): >8 S/CO — ABNORMAL HIGH (ref <0.90)

## 2013-10-20 MED ORDER — PENICILLIN G BENZATHINE 1200000 UNIT/2ML IM SUSP
1.2000 10*6.[IU] | Freq: Once | INTRAMUSCULAR | Status: AC
  Administered 2013-10-20: 1.2 10*6.[IU] via INTRAMUSCULAR

## 2013-10-20 NOTE — Assessment & Plan Note (Signed)
Has one lesion, given Aldara.

## 2013-10-20 NOTE — Progress Notes (Signed)
   Subjective:    Patient ID: Paul Palmer, male    DOB: 07-20-1986, 27 y.o.   MRN: [id number]  HPI He comes in for a work in visit.  Has had a rash for 2 weeks on his penis.  No skin problems.  Denies unprotected sexual contact.  No penile discharge.     Review of Systems  Constitutional: Negative for fever and fatigue.  Gastrointestinal: Negative for nausea and diarrhea.  Genitourinary: Negative for discharge, penile swelling, scrotal swelling and penile pain.  Skin: Positive for rash.  Neurological: Negative for dizziness.  Hematological: Positive for adenopathy.       Objective:   Physical Exam  Constitutional: He appears well-developed and well-nourished.  HENT:  Mouth/Throat: No oropharyngeal exudate.  Eyes: No scleral icterus.  Genitourinary:  Penis with dry scaling distally.  1 wart about 3-4 mm, slightly hyperpigmented          Assessment & Plan:

## 2013-10-20 NOTE — Telephone Encounter (Signed)
Message copied by Macy Mis on Fri Oct 20, 2013  9:17 AM ------      Message from: Gardiner Barefoot      Created: Fri Oct 20, 2013  5:55 AM       He has syphilis.  He needs one shot of Bicillin. Thanks ------

## 2013-10-20 NOTE — Assessment & Plan Note (Signed)
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  Will need one Bicillin injection.

## 2013-10-20 NOTE — Telephone Encounter (Signed)
He will come in today for the injection. Wendall Mola CMA

## 2013-11-06 ENCOUNTER — Telehealth (HOSPITAL_COMMUNITY): Payer: Self-pay

## 2013-11-06 NOTE — ED Notes (Signed)
[place] regarding recent Reactive RPR (Needed T. Pallidum and reason for visit and marital status)

## 2014-01-03 ENCOUNTER — Other Ambulatory Visit: Payer: Self-pay

## 2014-01-18 ENCOUNTER — Ambulatory Visit: Payer: Self-pay | Admitting: Internal Medicine

## 2014-07-31 ENCOUNTER — Ambulatory Visit (INDEPENDENT_AMBULATORY_CARE_PROVIDER_SITE_OTHER): Payer: BC Managed Care – PPO | Admitting: *Deleted

## 2014-07-31 ENCOUNTER — Ambulatory Visit: Payer: Self-pay

## 2014-07-31 ENCOUNTER — Telehealth: Payer: Self-pay | Admitting: *Deleted

## 2014-07-31 ENCOUNTER — Other Ambulatory Visit: Payer: BC Managed Care – PPO

## 2014-07-31 DIAGNOSIS — Z113 Encounter for screening for infections with a predominantly sexual mode of transmission: Secondary | ICD-10-CM

## 2014-07-31 DIAGNOSIS — B20 Human immunodeficiency virus [HIV] disease: Secondary | ICD-10-CM

## 2014-07-31 DIAGNOSIS — A64 Unspecified sexually transmitted disease: Secondary | ICD-10-CM

## 2014-07-31 MED ORDER — AZITHROMYCIN 250 MG PO TABS: 1000.0000 mg | ORAL_TABLET | Freq: Every day | ORAL | Status: DC

## 2014-07-31 MED ORDER — AZITHROMYCIN 250 MG PO TABS
1000.0000 mg | ORAL_TABLET | Freq: Once | ORAL | Status: AC
  Administered 2014-07-31: 1000 mg via ORAL

## 2014-07-31 MED ORDER — CEFTRIAXONE SODIUM 1 G IJ SOLR
250.0000 mg | Freq: Once | INTRAMUSCULAR | Status: AC
  Administered 2014-07-31: 250 mg via INTRAMUSCULAR

## 2014-07-31 NOTE — Patient Instructions (Signed)
Return for appt w/ Dr. Luciana Axeomer on Oct. 21 @ 2:45 PM.

## 2014-07-31 NOTE — Addendum Note (Signed)
Addended by: Jennet MaduroESTRIDGE, DENISE D on: 07/31/2014 03:54 PM   Modules accepted: Orders

## 2014-07-31 NOTE — Telephone Encounter (Signed)
Patient called reporting a "burning issue" in his penis.  Patient endorses recent unprotected sex.  Patient let RW/ADAP lapse, but claims to have [employer] insurance through his job now.  Patient states he has been off his Stribild for "a few months."    Per Dr. 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  Per Dr. Luciana Axeomer, will treat with 1 gm azithromycin, 250 mg IM rocephin OTO.  Will collect RPR and Urine Cytology Ancillary.  Patient can be scheduled for follow up in 4 weeks if he wants to restart medication.  Per Dr. Luciana Axeomer, will get a VL/CD4 at that time if he has restarted medication. Andree CossHowell, Ellinor Test M, RN

## 2014-07-31 NOTE — Progress Notes (Signed)
Pt given CDC handouts on chlamydia and GC.  Pt requested and was given [facility] information for [facility].  Pt given 2 bags of condoms.   Pt scheduled for follow-up appt with Dr. Luciana Axeomer.  Pt has started new employment and now has health and dental insurance.  Pt advised that if lab work returns positive the [facility] will be contacting him.  Pt verbalized understanding.

## 2014-08-01 LAB — URINE CYTOLOGY ANCILLARY ONLY
CHLAMYDIA, DNA PROBE: NEGATIVE
Neisseria Gonorrhea: POSITIVE — AB

## 2014-08-01 LAB — T-HELPER CELL (CD4) - (RCID CLINIC ONLY)
CD4 T CELL HELPER: 31 % — AB (ref 33–55)
CD4 T Cell Abs: 570 /uL (ref 400–2700)

## 2014-08-01 LAB — RPR

## 2014-08-22 ENCOUNTER — Encounter: Payer: Self-pay | Admitting: Internal Medicine

## 2014-08-22 ENCOUNTER — Ambulatory Visit (INDEPENDENT_AMBULATORY_CARE_PROVIDER_SITE_OTHER): Payer: BC Managed Care – PPO | Admitting: Internal Medicine

## 2014-08-22 VITALS — BP 121/80 | HR 62 | Temp 97.6°F | Ht 76.0 in | Wt 174.0 lb

## 2014-08-22 DIAGNOSIS — B360 Pityriasis versicolor: Secondary | ICD-10-CM | POA: Insufficient documentation

## 2014-08-22 DIAGNOSIS — Z23 Encounter for immunization: Secondary | ICD-10-CM

## 2014-08-22 DIAGNOSIS — Z79899 Other long term (current) drug therapy: Secondary | ICD-10-CM | POA: Diagnosis not present

## 2014-08-22 DIAGNOSIS — B20 Human immunodeficiency virus [HIV] disease: Secondary | ICD-10-CM | POA: Diagnosis not present

## 2014-08-22 MED ORDER — ELVITEG-COBIC-EMTRICIT-TENOFDF 150-150-200-300 MG PO TABS: 1.0000 | ORAL_TABLET | Freq: Every day | ORAL | Status: DC

## 2014-08-22 NOTE — Assessment & Plan Note (Signed)
Will try topical antifungal lotion.

## 2014-08-22 NOTE — Assessment & Plan Note (Signed)
To start back on Stribild again today.  RTC labs in 3 weeks and me in 5 weeks.  I discussed concerns with resistance when you stop and start threapy.

## 2014-08-22 NOTE — Progress Notes (Signed)
   Subjective:    Patient ID: Paul Palmer, male    DOB: [birth year], 28 y.o.   MRN: [id number]  HPI Here for follow up of HIV.  Was previously on Stribild but was getting insurance and has been off meds for several months.  Last CD4 570 last month.  No weight loss, no diarrhea.  Has had a rash on arm and back.     Review of Systems  Constitutional: Negative for fatigue and unexpected weight change.  HENT: Negative for trouble swallowing.   Respiratory: Negative for shortness of breath.   Gastrointestinal: Negative for nausea and diarrhea.  Skin: Negative for rash.  Neurological: Negative for dizziness and light-headedness.  Hematological: Negative for adenopathy.       Objective:   Physical Exam  Constitutional: He appears well-developed and well-nourished. No distress.  HENT:  Mouth/Throat: No oropharyngeal exudate.  Eyes: No scleral icterus.  Cardiovascular: Normal rate, regular rhythm and normal heart sounds.   No murmur heard. Pulmonary/Chest: Effort normal and breath sounds normal. No respiratory distress.  Lymphadenopathy:    He has no cervical adenopathy.  Skin:  + 3 small areas of hypopigmentation onright arm and large circular areas on right side of back c/w Tinea versicolor.            Assessment & Plan:

## 2014-08-23 ENCOUNTER — Ambulatory Visit (INDEPENDENT_AMBULATORY_CARE_PROVIDER_SITE_OTHER): Payer: BC Managed Care – PPO | Admitting: Family Medicine

## 2014-08-23 ENCOUNTER — Encounter: Payer: Self-pay | Admitting: Family Medicine

## 2014-08-23 VITALS — BP 100/70 | Temp 98.3°F | Ht 76.5 in | Wt 173.0 lb

## 2014-08-23 DIAGNOSIS — Z87891 Personal history of nicotine dependence: Secondary | ICD-10-CM | POA: Insufficient documentation

## 2014-08-23 DIAGNOSIS — Z720 Tobacco use: Secondary | ICD-10-CM

## 2014-08-23 DIAGNOSIS — F17200 Nicotine dependence, unspecified, uncomplicated: Secondary | ICD-10-CM

## 2014-08-23 DIAGNOSIS — B360 Pityriasis versicolor: Secondary | ICD-10-CM

## 2014-08-23 DIAGNOSIS — B20 Human immunodeficiency virus [HIV] disease: Secondary | ICD-10-CM

## 2014-08-23 MED ORDER — KETOCONAZOLE 2 % EX SHAM: 1.0000 "application " | MEDICATED_SHAMPOO | Freq: Every day | CUTANEOUS | Status: DC

## 2014-08-23 NOTE — Progress Notes (Signed)
Tana ConchStephen Hunter, MD Phone: [phone]  Subjective:  Patient presents today to establish care as new patient and with rash concern. Chief complaint-noted.   Rash on back Has been present for over 6 months. Itches. Whitish discoloration. Seems to be worsening. Spread all over back has 3 several spots on right arm. Has tried an avon lotion. No other treatments tried. Keeping moisturized helps reduce itching.  Had been off HIV meds for 6 months but rash was present well before he stopped. Has labs again in 4 weeks.  ROS-not ill appearing, no fever/chills. No new medications. No mucus membrane involvement. Is immunocompromised with HIV but has been well controlled   HIV-historically well controlled. Patient did stop medication x 6 months due to insurance concens but was restarted yesterday and has f/u labs in 1 month   The following were reviewed and entered/updated in epic: Past Medical History  Diagnosis Date  . HIV (human immunodeficiency virus infection)    Patient Active Problem List   Diagnosis Date Noted  . HIV disease 07/23/2011    Priority: High  . 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 Encounter for long-term (current) use of medications 08/22/2014    Priority: Low  . Tinea versicolor 08/22/2014    Priority: Low  . Healthcare maintenance 09/05/2013    Priority: Low  . Seasonal allergies 09/06/2012    Priority: Low   Past Surgical History  Procedure Laterality Date  . None      Family History  Problem Relation Age of Onset  . Hypertension Mother     and grandparents  . Diabetes      grandparents (deceased)    Medications- reviewed and updated Current Outpatient Prescriptions  Medication Sig Dispense Refill  . elvitegravir-cobicistat-emtricitabine-tenofovir (STRIBILD) 150-150-200-300 MG TABS tablet Take 1 tablet by mouth daily with breakfast.  30 tablet  6  . ketoconazole (NIZORAL) 2 % shampoo Apply 1 application topically daily. Apply for  5-7 days for 5 minutes then take a shower (at least do 5 days in a row)  120 mL  0  . pseudoephedrine-acetaminophen (TYLENOL SINUS) 30-500 MG TABS Take 2 tablets by mouth at bedtime.       No current facility-administered medications for this visit.    Allergies-reviewed and updated No Known Allergies  History   Social History  . Marital Status: Single    Spouse Name: N/A    Number of Children: N/A  . Years of Education: N/A   Social History Main Topics  . Smoking status: Current Every Day Smoker -- 0.25 packs/day for 9 years    Types: Cigarettes    Start date: 08/22/2005  . Smokeless tobacco: Never Used  . Alcohol Use: No  . Drug Use: 7.00 per week    Special: Marijuana  . Sexual Activity: Yes    Partners: Male     Comment: accepted condoms   Other Topics Concern  . Not on file   Social History Narrative   Currently in monogamous relationship with another male.       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  HEENT:  Mucous membranes are moist. Oropharynx normal. Good dentition.  Eyes: sclera and iris and lids normal Neck: no thyromegaly CV: RRR no murmurs rubs or gallops Lungs: CTAB no crackles, wheeze, rhonchi Abdomen: soft/nontender/nondistended/normal bowel sounds. No rebound or guarding. No hepatosplenomegaly.  Ext: no edema. 2+ PT pulses Skin: warm, dry, patient with multiple circular areas on skin with hypopigmentation on right arm and extensive throughout entirety of back Neuro: grossly normal, moves all extremities, PERRLA, 5/5 strength upper and lower extremities, 2+ reflexes   Assessment/Plan:  Tinea versicolor Reviewed  Dr. Ephriam Knucklesomer's note and agree that this is likely tinea versicolor. DId not see medication ordered. We decided to use ketoconazole 2% x 5-7 days with monthly maintenance with selsun blue. AVS and Rx with more detail.   HIV disease Previously well-controlled. Had increased risk of resistance due to 6 month off medicine. Fortunately ID is following closely with repeat labs in one month.  Smoker Discovered on social history. Advised of need for cessation the patient not interested  6 month follow up planned unless rash does not resolve.  Meds ordered this encounter  Medications  . ketoconazole (NIZORAL) 2 % shampoo    Sig: Apply 1 application topically daily. Apply for 5-7 days for 5 minutes then take a shower (at least do 5 days in a row)    Dispense:  120 mL    Refill:  0

## 2014-08-23 NOTE — Assessment & Plan Note (Signed)
Ended up being syphilis related. D/c problem.

## 2014-08-23 NOTE — Assessment & Plan Note (Signed)
Reviewed Dr. Ephriam Knucklesomer's note and agree that this is likely tinea versicolor. DId not see medication ordered. We decided to use ketoconazole 2% x 5-7 days with monthly maintenance with selsun blue. AVS and Rx with more detail.

## 2014-08-23 NOTE — Progress Notes (Signed)
Pre visit review using our clinic review tool, if applicable. No additional management support is needed unless otherwise documented below in the visit note. 

## 2014-08-23 NOTE — Patient Instructions (Signed)
Use ketoconazole shampoo as on directions then in 1 week use selsun blue covering area for 15 minutes then shower then do this at least once a month. Color may not normalize until 3-4 months or longer but itching should improve. If it does not improve after a month, please come see me.   Advise you to quit smoking.   Tinea Versicolor Tinea versicolor is a common yeast infection of the skin. This condition becomes known when the yeast on our skin starts to overgrow (yeast is a normal inhabitant on our skin). This condition is noticed as white or light brown patches on brown skin, and is more evident in the summer on tanned skin. These areas are slightly scaly if scratched. The light patches from the yeast become evident when the yeast creates "holes in your suntan". This is most often noticed in the summer. The patches are usually located on the chest, back, pubis, neck and body folds. However, it may occur on any area of body. Mild itching and inflammation (redness or soreness) may be present. DIAGNOSIS  The diagnosisof this is made clinically (by looking). Cultures from samples are usually not needed. Examination under the microscope may help. However, yeast is normally found on skin. The diagnosis still remains clinical. Examination under Wood's Ultraviolet Light can determine the extent of the infection. TREATMENT  This common infection is usually only of cosmetic (only a concern to your appearance). It is easily treated with dandruff shampoo used during showers or bathing. Vigorous scrubbing will eliminate the yeast over several days time. The light areas in your skin may remain for weeks or months after the infection is cured unless your skin is exposed to sunlight. The lighter or darker spots caused by the fungus that remain after complete treatment are not a sign of treatment failure; it will take a long time to resolve. Your caregiver may recommend a number of commercial preparations or medication  by mouth if home care is not working. Recurrence is common and preventative medication may be necessary. This skin condition is not highly contagious. Special care is not needed to protect close friends and family members. Normal hygiene is usually enough. Follow up is required only if you develop complications (such as a secondary infection from scratching), if recommended by your caregiver, or if no relief is obtained from the preparations used. Document Released: 10/16/2000 Document Revised: 01/11/2012 Document Reviewed: 11/28/2008 [facility] Patient Information 2015 [employer] [place]. This information is not intended to replace advice given to you by your health care provider. Make sure you discuss any questions you have with your health care provider.

## 2014-08-23 NOTE — Assessment & Plan Note (Signed)
Previously well-controlled. Had increased risk of resistance due to 6 month off medicine. Fortunately ID is following closely with repeat labs in one month.

## 2014-08-23 NOTE — Assessment & Plan Note (Signed)
Advised of need for cessation the patient not interested

## 2014-09-02 ENCOUNTER — Telehealth: Payer: Self-pay | Admitting: Family

## 2014-09-02 ENCOUNTER — Emergency Department (HOSPITAL_COMMUNITY)
Admission: EM | Admit: 2014-09-02 | Discharge: 2014-09-03 | Disposition: A | Discharge status: Home / Self Care | Payer: BC Managed Care – PPO | Attending: Emergency Medicine | Admitting: Emergency Medicine

## 2014-09-02 ENCOUNTER — Encounter (HOSPITAL_COMMUNITY): Payer: Self-pay | Admitting: Emergency Medicine

## 2014-09-02 DIAGNOSIS — R509 Fever, unspecified: Secondary | ICD-10-CM | POA: Diagnosis present

## 2014-09-02 DIAGNOSIS — J019 Acute sinusitis, unspecified: Secondary | ICD-10-CM

## 2014-09-02 DIAGNOSIS — B349 Viral infection, unspecified: Secondary | ICD-10-CM | POA: Diagnosis not present

## 2014-09-02 DIAGNOSIS — Z21 Asymptomatic human immunodeficiency virus [HIV] infection status: Secondary | ICD-10-CM | POA: Diagnosis not present

## 2014-09-02 DIAGNOSIS — Z720 Tobacco use: Secondary | ICD-10-CM | POA: Diagnosis not present

## 2014-09-02 DIAGNOSIS — Z79899 Other long term (current) drug therapy: Secondary | ICD-10-CM | POA: Insufficient documentation

## 2014-09-02 DIAGNOSIS — R05 Cough: Secondary | ICD-10-CM

## 2014-09-02 DIAGNOSIS — R059 Cough, unspecified: Secondary | ICD-10-CM

## 2014-09-02 MED ORDER — AMOXICILLIN-POT CLAVULANATE 875-125 MG PO TABS: 1.0000 | ORAL_TABLET | Freq: Two times a day (BID) | ORAL | Status: DC

## 2014-09-02 MED ORDER — ACETAMINOPHEN 500 MG PO TABS
1000.0000 mg | ORAL_TABLET | Freq: Once | ORAL | Status: AC
  Administered 2014-09-03: 1000 mg via ORAL
  Filled 2014-09-02: qty 2

## 2014-09-02 NOTE — Progress Notes (Signed)

## 2014-09-02 NOTE — ED Notes (Signed)
Pt states he has generalized body aches, fever, cough, headache, shortness of breath, and soreness in his chest  Pt states sxs started 2 days ago   Pt states he has been using tylenol sinus and today he used mucinex  Pt states it is not helping

## 2014-09-03 ENCOUNTER — Emergency Department (HOSPITAL_COMMUNITY): Payer: BC Managed Care – PPO

## 2014-09-03 ENCOUNTER — Encounter: Payer: Self-pay | Admitting: Family Medicine

## 2014-09-03 LAB — I-STAT CHEM 8, ED
BUN: <3 mg/dL — ABNORMAL LOW (ref 6–23)
CALCIUM ION: 1.08 mmol/L — AB (ref 1.12–1.23)
CHLORIDE: 104 meq/L (ref 96–112)
Creatinine, Ser: 1 mg/dL (ref 0.50–1.35)
GLUCOSE: 101 mg/dL — AB (ref 70–99)
HCT: 44 % (ref 39.0–52.0)
Hemoglobin: 15 g/dL (ref 13.0–17.0)
POTASSIUM: 4 meq/L (ref 3.7–5.3)
Sodium: 137 mEq/L (ref 137–147)
TCO2: 25 mmol/L (ref 0–100)

## 2014-09-03 LAB — CBC WITH DIFFERENTIAL/PLATELET
Basophils Absolute: 0 10*3/uL (ref 0.0–0.1)
Basophils Relative: 0 % (ref 0–1)
Eosinophils Absolute: 0.2 10*3/uL (ref 0.0–0.7)
Eosinophils Relative: 4 % (ref 0–5)
HEMATOCRIT: 42.2 % (ref 39.0–52.0)
HEMOGLOBIN: 15 g/dL (ref 13.0–17.0)
LYMPHS PCT: 20 % (ref 12–46)
Lymphs Abs: 0.9 10*3/uL (ref 0.7–4.0)
MCH: 32.6 pg (ref 26.0–34.0)
MCHC: 35.5 g/dL (ref 30.0–36.0)
MCV: 91.7 fL (ref 78.0–100.0)
MONO ABS: 0.6 10*3/uL (ref 0.1–1.0)
MONOS PCT: 14 % — AB (ref 3–12)
Neutro Abs: 2.6 10*3/uL (ref 1.7–7.7)
Neutrophils Relative %: 62 % (ref 43–77)
Platelets: 208 10*3/uL (ref 150–400)
RBC: 4.6 MIL/uL (ref 4.22–5.81)
RDW: 12.6 % (ref 11.5–15.5)
WBC: 4.3 10*3/uL (ref 4.0–10.5)

## 2014-09-03 MED ORDER — OSELTAMIVIR PHOSPHATE 75 MG PO CAPS: 75.0000 mg | ORAL_CAPSULE | Freq: Two times a day (BID) | ORAL | Status: DC

## 2014-09-03 MED ORDER — SODIUM CHLORIDE 0.9 % IV BOLUS (SEPSIS)
1000.0000 mL | Freq: Once | INTRAVENOUS | Status: AC
  Administered 2014-09-03: 1000 mL via INTRAVENOUS

## 2014-09-03 MED ORDER — DEXTROMETHORPHAN POLISTIREX 30 MG/5ML PO LQCR: 15.0000 mg | ORAL | Status: DC | PRN

## 2014-09-03 MED ORDER — MORPHINE SULFATE 4 MG/ML IJ SOLN
4.0000 mg | Freq: Once | INTRAMUSCULAR | Status: AC
  Administered 2014-09-03: 4 mg via INTRAVENOUS
  Filled 2014-09-03: qty 1

## 2014-09-03 MED ORDER — ONDANSETRON HCL 4 MG/2ML IJ SOLN
4.0000 mg | Freq: Once | INTRAMUSCULAR | Status: AC
  Administered 2014-09-03: 4 mg via INTRAVENOUS
  Filled 2014-09-03: qty 2

## 2014-09-03 NOTE — Discharge Instructions (Signed)

## 2014-09-03 NOTE — ED Provider Notes (Signed)
CSN: [id number]     Arrival date & time 09/02/14  2147 History   First MD Initiated Contact with Patient 09/02/14 2350     Chief Complaint  Patient presents with  . Generalized Body Aches  . Fever  . Shortness of Breath     (Consider location/radiation/quality/duration/timing/severity/associated sxs/prior Treatment) HPI Comments: Patient with HIV presents emergency department with chief complaint of generalized body aches, fever, cough, and headache. Patient states symptoms started about 2 days ago. He reports he did get a flu shot. He has not taken anything to alleviate the symptoms. Patient is followed by infectious disease. He has been taking his antivirals as directed. Last CD4 count was 570, viral load was less than 20, copies. Patient denies any other symptoms.  The history is provided by the patient. No language interpreter was used.    Past Medical History  Diagnosis Date  . HIV (human immunodeficiency virus infection)    Past Surgical History  Procedure Laterality Date  . None     Family History  Problem Relation Age of Onset  . Hypertension Mother     and grandparents  . Diabetes      grandparents (deceased)   History  Substance Use Topics  . Smoking status: Current Every Day Smoker -- 0.25 packs/day for 9 years    Types: Cigarettes    Start date: 08/22/2005  . Smokeless tobacco: Never Used  . Alcohol Use: 0.0 oz/week     Comment: occ    Review of Systems  Constitutional: Positive for fever. Negative for chills.  Respiratory: Positive for cough. Negative for shortness of breath.   Cardiovascular: Negative for chest pain.  Gastrointestinal: Negative for nausea, vomiting, diarrhea and constipation.  Genitourinary: Negative for dysuria.  All other systems reviewed and are negative.     Allergies  Review of patient's allergies indicates no known allergies.  Home Medications   Prior to Admission medications   Medication Sig Start Date End Date Taking?  Authorizing Provider  amoxicillin-clavulanate (AUGMENTIN) 875-125 MG per tablet Take 1 tablet by mouth 2 (two) times daily. 09/02/14   Junie Spencerhristy A Hawks, FNP  elvitegravir-cobicistat-emtricitabine-tenofovir (STRIBILD) 150-150-200-300 MG TABS tablet Take 1 tablet by mouth daily with breakfast. 08/22/14   Gardiner Barefootobert W Comer, MD  ketoconazole (NIZORAL) 2 % shampoo Apply 1 application topically daily. Apply for 5-7 days for 5 minutes then take a shower (at least do 5 days in a row) 08/23/14   Shelva MajesticStephen O Hunter, MD  pseudoephedrine-acetaminophen (TYLENOL SINUS) 30-500 MG TABS Take 2 tablets by mouth at bedtime.    Historical Provider, MD   BP 120/66 mmHg  Pulse 93  Temp(Src) 100.4 F (38 C) (Oral)  Resp 18  Ht 6\' 4"  (1.93 m)  Wt 173 lb (78.472 kg)  BMI 21.07 kg/m2  SpO2 98% Physical Exam  Constitutional: He is oriented to person, place, and time. He appears well-developed and well-nourished.  HENT:  Head: Normocephalic and atraumatic.  Eyes: Conjunctivae and EOM are normal. Pupils are equal, round, and reactive to light. Right eye exhibits no discharge. Left eye exhibits no discharge. No scleral icterus.  Neck: Normal range of motion. Neck supple. No JVD present.  Cardiovascular: Normal rate, regular rhythm and normal heart sounds.  Exam reveals no gallop and no friction rub.   No murmur heard. Pulmonary/Chest: Effort normal and breath sounds normal. No respiratory distress. He has no wheezes. He has no rales. He exhibits no tenderness.  Clear to auscultation  Abdominal: Soft. He exhibits no  distension and no mass. There is no tenderness. There is no rebound and no guarding.  Musculoskeletal: Normal range of motion. He exhibits no edema or tenderness.  Neurological: He is alert and oriented to person, place, and time.  Skin: Skin is warm and dry.  Psychiatric: He has a normal mood and affect. His behavior is normal. Judgment and thought content normal.  Nursing note and vitals reviewed.   ED  Course  Procedures (including critical care time) Results for orders placed or performed during the hospital encounter of 09/02/14  CBC with Differential  Result Value Ref Range   WBC 4.3 4.0 - 10.5 K/uL   RBC 4.60 4.22 - 5.81 MIL/uL   Hemoglobin 15.0 13.0 - 17.0 g/dL   HCT 16.142.2 09.639.0 - 04.552.0 %   MCV 91.7 78.0 - 100.0 fL   MCH 32.6 26.0 - 34.0 pg   MCHC 35.5 30.0 - 36.0 g/dL   RDW 40.912.6 81.111.5 - 91.415.5 %   Platelets 208 150 - 400 K/uL   Neutrophils Relative % 62 43 - 77 %   Neutro Abs 2.6 1.7 - 7.7 K/uL   Lymphocytes Relative 20 12 - 46 %   Lymphs Abs 0.9 0.7 - 4.0 K/uL   Monocytes Relative 14 (H) 3 - 12 %   Monocytes Absolute 0.6 0.1 - 1.0 K/uL   Eosinophils Relative 4 0 - 5 %   Eosinophils Absolute 0.2 0.0 - 0.7 K/uL   Basophils Relative 0 0 - 1 %   Basophils Absolute 0.0 0.0 - 0.1 K/uL  I-stat chem 8, ed  Result Value Ref Range   Sodium 137 137 - 147 mEq/L   Potassium 4.0 3.7 - 5.3 mEq/L   Chloride 104 96 - 112 mEq/L   BUN <3 (L) 6 - 23 mg/dL   Creatinine, Ser 7.821.00 0.50 - 1.35 mg/dL   Glucose, Bld 956101 (H) 70 - 99 mg/dL   Calcium, Ion 2.131.08 (L) 1.12 - 1.23 mmol/L   TCO2 25 0 - 100 mmol/L   Hemoglobin 15.0 13.0 - 17.0 g/dL   HCT 08.644.0 57.839.0 - 46.952.0 %   Dg Chest 2 View  09/03/2014   CLINICAL DATA:  Cough, fever and body aches for 72 hr.  EXAM: CHEST  2 VIEW  COMPARISON:  None.  FINDINGS: The heart size and mediastinal contours are within normal limits. Both lungs are clear. The visualized skeletal structures are unremarkable.  IMPRESSION: No active cardiopulmonary disease.   Electronically Signed   By: Richarda OverlieAdam  Henn M.D.   On: 09/03/2014 00:32      EKG Interpretation None      MDM   Final diagnoses:  Cough  Viral syndrome    Patient with cough, low-grade fever. Patient has a history of HIV. Will check chest x-ray. Will reassess.  Chest x-ray is clear. Labs are reassuring. Will treat with Tamiflu. Vitals are stable. Recommend fluids, and Tylenol and ibuprofen for fever.  Recommend primary care follow-up. Specific return precautions given. Patient understands and agrees with the plan. He is stable and ready for discharge.  Discussed with Dr. Juleen ChinaKohut, who agrees with the plan.   Roxy Horsemanobert Sherea Liptak, PA-C 09/03/14 [id number]  Raeford RazorStephen Kohut, MD 09/06/14 [phone]

## 2014-09-20 ENCOUNTER — Other Ambulatory Visit: Payer: BC Managed Care – PPO

## 2014-09-20 DIAGNOSIS — Z79899 Other long term (current) drug therapy: Secondary | ICD-10-CM

## 2014-09-20 DIAGNOSIS — B20 Human immunodeficiency virus [HIV] disease: Secondary | ICD-10-CM

## 2014-09-20 LAB — COMPLETE METABOLIC PANEL WITH GFR
ALBUMIN: 4.5 g/dL (ref 3.5–5.2)
ALK PHOS: 50 U/L (ref 39–117)
ALT: 17 U/L (ref 0–53)
AST: 17 U/L (ref 0–37)
BUN: 8 mg/dL (ref 6–23)
CHLORIDE: 103 meq/L (ref 96–112)
CO2: 24 mEq/L (ref 19–32)
Calcium: 9.7 mg/dL (ref 8.4–10.5)
Creat: 0.97 mg/dL (ref 0.50–1.35)
GFR, Est African American: >89 mL/min
GFR, Est Non African American: >89 mL/min
GLUCOSE: 93 mg/dL (ref 70–99)
POTASSIUM: 4.5 meq/L (ref 3.5–5.3)
Sodium: 140 mEq/L (ref 135–145)
Total Bilirubin: 0.6 mg/dL (ref 0.2–1.2)
Total Protein: 7.8 g/dL (ref 6.0–8.3)

## 2014-09-20 LAB — LIPID PANEL
Cholesterol: 141 mg/dL (ref 0–200)
HDL: 33 mg/dL — ABNORMAL LOW (ref >39)
LDL CALC: 92 mg/dL (ref 0–99)
Total CHOL/HDL Ratio: 4.3 Ratio
Triglycerides: 78 mg/dL (ref <150)
VLDL: 16 mg/dL (ref 0–40)

## 2014-09-20 LAB — CBC WITH DIFFERENTIAL/PLATELET
Basophils Absolute: 0 10*3/uL (ref 0.0–0.1)
Basophils Relative: 0 % (ref 0–1)
EOS ABS: 0.3 10*3/uL (ref 0.0–0.7)
EOS PCT: 9 % — AB (ref 0–5)
HEMATOCRIT: 45.8 % (ref 39.0–52.0)
Hemoglobin: 16.5 g/dL (ref 13.0–17.0)
Lymphocytes Relative: 46 % (ref 12–46)
Lymphs Abs: 1.5 10*3/uL (ref 0.7–4.0)
MCH: 32.5 pg (ref 26.0–34.0)
MCHC: 36 g/dL (ref 30.0–36.0)
MCV: 90.3 fL (ref 78.0–100.0)
MONO ABS: 0.3 10*3/uL (ref 0.1–1.0)
MPV: 9.4 fL (ref 9.4–12.4)
Monocytes Relative: 8 % (ref 3–12)
Neutro Abs: 1.2 10*3/uL — ABNORMAL LOW (ref 1.7–7.7)
Neutrophils Relative %: 37 % — ABNORMAL LOW (ref 43–77)
Platelets: 269 10*3/uL (ref 150–400)
RBC: 5.07 MIL/uL (ref 4.22–5.81)
RDW: 13.2 % (ref 11.5–15.5)
WBC: 3.3 10*3/uL — ABNORMAL LOW (ref 4.0–10.5)

## 2014-09-21 LAB — HIV-1 RNA QUANT-NO REFLEX-BLD: HIV-1 RNA Quant, Log: <1.3 {Log} (ref <1.30)

## 2014-09-21 LAB — T-HELPER CELL (CD4) - (RCID CLINIC ONLY)
CD4 T CELL ABS: 480 /uL (ref 400–2700)
CD4 T CELL HELPER: 29 % — AB (ref 33–55)

## 2014-09-28 LAB — HLA B*5701: HLA-B 5701 W/RFLX HLA-B HIGH: NEGATIVE

## 2014-10-02 ENCOUNTER — Ambulatory Visit (INDEPENDENT_AMBULATORY_CARE_PROVIDER_SITE_OTHER): Payer: BC Managed Care – PPO | Admitting: Internal Medicine

## 2014-10-02 ENCOUNTER — Encounter: Payer: Self-pay | Admitting: Internal Medicine

## 2014-10-02 VITALS — BP 120/83 | HR 76 | Temp 97.8°F | Ht 76.0 in | Wt 171.0 lb

## 2014-10-02 DIAGNOSIS — B20 Human immunodeficiency virus [HIV] disease: Secondary | ICD-10-CM | POA: Diagnosis not present

## 2014-10-02 NOTE — Assessment & Plan Note (Signed)
He seems to be doing well again back on his medication. I again encouraged continued compliance. I will have him return in 3 months to be sure it remains undetectable.

## 2014-10-02 NOTE — Progress Notes (Signed)
   Subjective:    Patient ID: Paul Palmer, male    DOB: 06-20-1986, 28 y.o.   MRN: [id number]  HPI Here for follow up of HIV.  Was previously on Stribild but was getting insurance and had been off meds for several months.  He restarted after his last visit and has been on it several months. No issues with the medication.  His CD4 is 480 and his viral load is undetectable again. He is recovering from the flu. No weight loss, no diarrhea.      Review of Systems  Constitutional: Negative for fatigue and unexpected weight change.  HENT: Negative for trouble swallowing.   Respiratory: Negative for shortness of breath.   Gastrointestinal: Negative for nausea and diarrhea.  Skin: Negative for rash.  Neurological: Negative for dizziness and light-headedness.  Hematological: Negative for adenopathy.       Objective:   Physical Exam  Constitutional: He appears well-developed and well-nourished. No distress.  HENT:  Mouth/Throat: No oropharyngeal exudate.  Eyes: No scleral icterus.  Cardiovascular: Normal rate, regular rhythm and normal heart sounds.   No murmur heard. Pulmonary/Chest: Effort normal and breath sounds normal. No respiratory distress.  Lymphadenopathy:    He has no cervical adenopathy.  Skin: No rash noted.          Assessment & Plan:

## 2014-12-10 ENCOUNTER — Other Ambulatory Visit: Payer: Self-pay | Admitting: Infectious Diseases

## 2014-12-10 DIAGNOSIS — B20 Human immunodeficiency virus [HIV] disease: Secondary | ICD-10-CM

## 2015-01-01 ENCOUNTER — Other Ambulatory Visit: Payer: BC Managed Care – PPO

## 2015-01-02 ENCOUNTER — Ambulatory Visit: Payer: Self-pay

## 2015-01-02 ENCOUNTER — Other Ambulatory Visit: Payer: Self-pay

## 2015-01-15 ENCOUNTER — Ambulatory Visit: Payer: BC Managed Care – PPO | Admitting: Internal Medicine

## 2015-02-22 ENCOUNTER — Ambulatory Visit: Payer: Self-pay | Admitting: Family Medicine

## 2015-02-22 DIAGNOSIS — Z0289 Encounter for other administrative examinations: Secondary | ICD-10-CM

## 2015-06-04 ENCOUNTER — Telehealth: Payer: Self-pay | Admitting: *Deleted

## 2015-06-04 NOTE — Telephone Encounter (Signed)
Patient called c/o sores on his genital area and admits to unprotected sex 2 months ago. Requested an urgent appt and nothing available until the end of the month. Advised patient [facility] for STDs and treated if necessary. He said he was tested here previously and explained that was during a visit.  Paul Palmer

## 2015-06-06 ENCOUNTER — Ambulatory Visit: Payer: Self-pay

## 2015-07-11 ENCOUNTER — Ambulatory Visit: Payer: Self-pay | Admitting: Internal Medicine

## 2015-12-30 ENCOUNTER — Other Ambulatory Visit (INDEPENDENT_AMBULATORY_CARE_PROVIDER_SITE_OTHER): Payer: Self-pay

## 2015-12-30 ENCOUNTER — Ambulatory Visit: Payer: Self-pay

## 2015-12-30 DIAGNOSIS — B20 Human immunodeficiency virus [HIV] disease: Secondary | ICD-10-CM

## 2015-12-30 DIAGNOSIS — Z113 Encounter for screening for infections with a predominantly sexual mode of transmission: Secondary | ICD-10-CM

## 2015-12-30 LAB — COMPLETE METABOLIC PANEL WITH GFR
ALT: 14 U/L (ref 9–46)
AST: 16 U/L (ref 10–40)
Albumin: 4.2 g/dL (ref 3.6–5.1)
Alkaline Phosphatase: 52 U/L (ref 40–115)
BUN: 8 mg/dL (ref 7–25)
CHLORIDE: 103 mmol/L (ref 98–110)
CO2: 26 mmol/L (ref 20–31)
Calcium: 9.8 mg/dL (ref 8.6–10.3)
Creat: 0.84 mg/dL (ref 0.60–1.35)
GFR, Est Non African American: >89 mL/min (ref >=60)
Glucose, Bld: 83 mg/dL (ref 65–99)
Potassium: 4.7 mmol/L (ref 3.5–5.3)
Sodium: 138 mmol/L (ref 135–146)
Total Bilirubin: 0.8 mg/dL (ref 0.2–1.2)
Total Protein: 7.7 g/dL (ref 6.1–8.1)

## 2015-12-30 LAB — CBC WITH DIFFERENTIAL/PLATELET
Basophils Absolute: 0 10*3/uL (ref 0.0–0.1)
Basophils Relative: 0 % (ref 0–1)
EOS ABS: 0.2 10*3/uL (ref 0.0–0.7)
EOS PCT: 6 % — AB (ref 0–5)
HCT: 45.5 % (ref 39.0–52.0)
Hemoglobin: 16 g/dL (ref 13.0–17.0)
LYMPHS PCT: 50 % — AB (ref 12–46)
Lymphs Abs: 1.7 10*3/uL (ref 0.7–4.0)
MCH: 32 pg (ref 26.0–34.0)
MCHC: 35.2 g/dL (ref 30.0–36.0)
MCV: 91 fL (ref 78.0–100.0)
MONO ABS: 0.3 10*3/uL (ref 0.1–1.0)
MPV: 9.5 fL (ref 8.6–12.4)
Monocytes Relative: 9 % (ref 3–12)
Neutro Abs: 1.2 10*3/uL — ABNORMAL LOW (ref 1.7–7.7)
Neutrophils Relative %: 35 % — ABNORMAL LOW (ref 43–77)
PLATELETS: 289 10*3/uL (ref 150–400)
RBC: 5 MIL/uL (ref 4.22–5.81)
RDW: 13.3 % (ref 11.5–15.5)
WBC: 3.3 10*3/uL — AB (ref 4.0–10.5)

## 2015-12-31 LAB — HIV-1 RNA ULTRAQUANT REFLEX TO GENTYP+
HIV 1 RNA Quant: 2951 copies/mL — ABNORMAL HIGH (ref <20)
HIV-1 RNA Quant, Log: 3.47 Log copies/mL — ABNORMAL HIGH (ref <1.30)

## 2015-12-31 LAB — RPR: RPR Ser Ql: REACTIVE — AB

## 2015-12-31 LAB — T-HELPER CELL (CD4) - (RCID CLINIC ONLY)
CD4 T CELL ABS: 540 /uL (ref 400–2700)
CD4 T CELL HELPER: 32 % — AB (ref 33–55)

## 2015-12-31 LAB — RPR TITER: RPR Titer: 1:1 {titer}

## 2015-12-31 LAB — URINE CYTOLOGY ANCILLARY ONLY
Chlamydia: POSITIVE — AB
Neisseria Gonorrhea: NEGATIVE

## 2016-01-01 ENCOUNTER — Telehealth: Payer: Self-pay | Admitting: *Deleted

## 2016-01-01 DIAGNOSIS — A749 Chlamydial infection, unspecified: Secondary | ICD-10-CM

## 2016-01-01 LAB — FLUORESCENT TREPONEMAL AB(FTA)-IGG-BLD: Fluorescent Treponemal ABS: REACTIVE — AB

## 2016-01-01 MED ORDER — AZITHROMYCIN 500 MG PO TABS: 1000.0000 mg | ORAL_TABLET | Freq: Every day | ORAL | Status: DC

## 2016-01-01 NOTE — Telephone Encounter (Signed)
Order received for treatment of STI, chlamydia from Dr. Luciana Axe.  Azithromycin 1 gram by mouth once.  Informed pt of STI, to pick up rx and notify partners to obtain treatment.

## 2016-01-01 NOTE — Telephone Encounter (Signed)
-----   Message from Gardiner Barefoot, MD sent at 01/01/2016  1:41 PM EST ----- He needs 1 gram azithromycin for chlamydia. thanks

## 2016-01-06 ENCOUNTER — Ambulatory Visit (INDEPENDENT_AMBULATORY_CARE_PROVIDER_SITE_OTHER): Payer: Self-pay | Admitting: *Deleted

## 2016-01-06 DIAGNOSIS — A749 Chlamydial infection, unspecified: Secondary | ICD-10-CM

## 2016-01-06 MED ORDER — AZITHROMYCIN 250 MG PO TABS
1000.0000 mg | ORAL_TABLET | Freq: Once | ORAL | Status: AC
  Administered 2016-01-06: 1000 mg via ORAL

## 2016-01-06 MED ORDER — AZITHROMYCIN 250 MG PO TABS: 1000.0000 mg | ORAL_TABLET | Freq: Every day | ORAL | Status: DC

## 2016-01-06 NOTE — Progress Notes (Signed)
Pt received treatment.  Offered condoms, declined.  Said he had at home.  Had previously been instructed to talk with his partners.

## 2016-01-07 LAB — HIV-1 GENOTYPR PLUS

## 2016-01-22 ENCOUNTER — Ambulatory Visit: Payer: Self-pay

## 2016-01-22 ENCOUNTER — Encounter: Payer: Self-pay | Admitting: Internal Medicine

## 2016-01-22 ENCOUNTER — Ambulatory Visit (INDEPENDENT_AMBULATORY_CARE_PROVIDER_SITE_OTHER): Payer: Self-pay | Admitting: Internal Medicine

## 2016-01-22 VITALS — BP 125/77 | HR 76 | Temp 97.5°F | Wt 170.5 lb

## 2016-01-22 DIAGNOSIS — B20 Human immunodeficiency virus [HIV] disease: Secondary | ICD-10-CM

## 2016-01-22 DIAGNOSIS — A5149 Other secondary syphilitic conditions: Secondary | ICD-10-CM

## 2016-01-22 DIAGNOSIS — Z8619 Personal history of other infectious and parasitic diseases: Secondary | ICD-10-CM

## 2016-01-22 DIAGNOSIS — A749 Chlamydial infection, unspecified: Secondary | ICD-10-CM

## 2016-01-22 MED ORDER — KETOCONAZOLE 2 % EX SHAM: 1.0000 "application " | MEDICATED_SHAMPOO | Freq: Every day | CUTANEOUS | Status: DC

## 2016-01-22 NOTE — Progress Notes (Signed)
Patient Active Problem List   Diagnosis Date Noted  . HIV disease (HCC) 07/23/2011    Priority: High  . Chlamydia 01/22/2016    Priority: Medium  . History of gonorrhea 01/22/2016    Priority: Medium  . 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 Former smoker 08/23/2014  . Encounter for long-term (current) use of medications 08/22/2014  . Tinea versicolor 08/22/2014  . Healthcare maintenance 09/05/2013  . Seasonal allergies 09/06/2012    Patient's Medications  New Prescriptions   No medications on file  Previous Medications   AZITHROMYCIN (ZITHROMAX) 500 MG TABLET    Take 2 tablets (1,000 mg total) by mouth daily.   ELVITEGRAVIR-COBICISTAT-EMTRICITABINE-TENOFOVIR (STRIBILD) 150-150-200-300 MG TABS TABLET    Take 1 tablet by mouth daily with breakfast.   PSEUDOEPHEDRINE-ACETAMINOPHEN (TYLENOL SINUS) 30-500 MG TABS    Take 2 tablets by mouth at bedtime. Reported on 01/22/2016  Modified Medications   Modified Medication Previous Medication   KETOCONAZOLE (NIZORAL) 2 % SHAMPOO ketoconazole (NIZORAL) 2 % shampoo      Apply 1 application topically daily. Apply for 5-7 days for 5 minutes then take a shower (at least do 5 days in a row)    Apply 1 application topically daily. Apply for 5-7 days for 5 minutes then take a shower (at least do 5 days in a row)  Discontinued Medications   No medications on file    Subjective: Paul Palmer is in for his first HIV follow-up visit since December 2015. He has been followed by my partner, Dr. Luciana Axe, in the past. He was taking Stribild that he obtained through [facility] then he got a job with insurance. Unfortunately he lost a job last year. His insurance allowed him to obtain Stribild for a little while longer but now he has been out of his medication for several months. He wanted to come in today to see how things were and make sure that he got started back on therapy. He has been feeling well and without complaints. However  when he came in for routine blood work last month his Chlamydia screen was positive he has not had any urinary score rectal symptoms. He did complete a course of azithromycin. He has only had one male partner in the past year. He is not sure if his partner is monogamous. They do not always use condoms.  Review of Systems: Review of Systems  Constitutional: Negative for fever, chills, weight loss, malaise/fatigue and diaphoresis.  HENT: Negative for sore throat.   Respiratory: Negative for cough, sputum production and shortness of breath.   Cardiovascular: Negative for chest pain.  Gastrointestinal: Negative for nausea, vomiting and diarrhea.  Genitourinary: Negative for dysuria and frequency.  Musculoskeletal: Negative for myalgias and joint pain.  Skin: Negative for rash.  Neurological: Negative for dizziness and headaches.  Psychiatric/Behavioral: Negative for depression and substance abuse. The patient is not nervous/anxious.     Past Medical History  Diagnosis Date  . HIV (human immunodeficiency virus infection) (HCC)     Social History  Substance Use Topics  . Smoking status: Former Smoker -- 0.25 packs/day for 9 years    Types: Cigarettes    Start date: 08/22/2005  . Smokeless tobacco: Never Used     Comment: congratulated!!!  . Alcohol Use: 0.0 oz/week    0 Standard drinks or equivalent per week     Comment: occ    Family History  Problem Relation Age  of Onset  . Hypertension Mother     and grandparents  . Diabetes      grandparents (deceased)    No Known Allergies  Objective:  Filed Vitals:   01/22/16 1537  BP: 125/77  Pulse: 76  Temp: 97.5 F (36.4 C)  TempSrc: Oral  Weight: 170 lb 8 oz (77.338 kg)   Body mass index is 20.76 kg/(m^2).  Physical Exam  Constitutional: He is oriented to person, place, and time.  He is well dressed and in good spirits.  HENT:  Mouth/Throat: No oropharyngeal exudate.  Cardiovascular: Normal rate and regular rhythm.     No murmur heard. Pulmonary/Chest: Breath sounds normal.  Abdominal: Soft.  Musculoskeletal: Normal range of motion.  Neurological: He is alert and oriented to person, place, and time. Gait normal.  Skin: No rash noted.  Psychiatric: Mood and affect normal.    Lab Results Lab Results  Component Value Date   WBC 3.3* 12/30/2015   HGB 16.0 12/30/2015   HCT 45.5 12/30/2015   MCV 91.0 12/30/2015   PLT 289 12/30/2015    Lab Results  Component Value Date   CREATININE 0.84 12/30/2015   BUN 8 12/30/2015   NA 138 12/30/2015   K 4.7 12/30/2015   CL 103 12/30/2015   CO2 26 12/30/2015    Lab Results  Component Value Date   ALT 14 12/30/2015   AST 16 12/30/2015   ALKPHOS 52 12/30/2015   BILITOT 0.8 12/30/2015    Lab Results  Component Value Date   CHOL 141 09/20/2014   HDL 33* 09/20/2014   LDLCALC 92 09/20/2014   TRIG 78 09/20/2014   CHOLHDL 4.3 09/20/2014    Lab Results HIV 1 RNA QUANT (copies/mL)  Date Value  12/30/2015 2951*  09/20/2014 <20  08/02/2013 <20   CD4 T CELL ABS (/uL)  Date Value  12/30/2015 540  09/20/2014 480  07/31/2014 570      Problem List Items Addressed This Visit      High   HIV disease (HCC)    His HIV viral load has reactivated as expected off of Stribild. He states that he was not missing doses prior to running out several months ago. His genotype does not reveal any resistance mutations. He needs to get an integrase resistance assay today. Fortunately he has had his [facility] recertified. I talked to him about current treatment options including continuing off of therapy until his resistance assay results are available in about 2 weeks versus giving him a boosted regimen of Genvoya and Prezista. He prefers to wait until we have the resistance assay results. He will follow-up in 2 weeks.      Relevant Medications   ketoconazole (NIZORAL) 2 % shampoo   Other Relevant Orders   Urine cytology ancillary only   HIV-1 Integrase Genotype      Medium   Chlamydia - Primary    He has now had syphilis, gonorrhea and chlamydia in consecutive years. I talked him about the most importance of selecting his partners carefully, limiting the number partners he has and using condoms correctly and consistently. He will [facility] a repeat test [facility] and chlamydia today as a test of cure.      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      Relevant  Medications   ketoconazole (NIZORAL) 2 % shampoo        Cliffton Asters, MD [facility] for Infectious Disease [facility] [phone] pager   [phone] cell 01/22/2016, 5:00 PM

## 2016-01-22 NOTE — Assessment & Plan Note (Signed)
His HIV viral load has reactivated as expected off of Stribild. He states that he was not missing doses prior to running out several months ago. His genotype does not reveal any resistance mutations. He needs to get an integrase resistance assay today. Fortunately he has had his [facility] recertified. I talked to him about current treatment options including continuing off of therapy until his resistance assay results are available in about 2 weeks versus giving him a boosted regimen of Genvoya and Prezista. He prefers to wait until we have the resistance assay results. He will follow-up in 2 weeks.

## 2016-01-22 NOTE — Assessment & Plan Note (Signed)
He has now had syphilis, gonorrhea and chlamydia in consecutive years. I talked him about the most importance of selecting his partners carefully, limiting the number partners he has and using condoms correctly and consistently. He will get a [facility] test [facility] and chlamydia today as a test of cure.

## 2016-01-22 NOTE — Assessment & Plan Note (Signed)
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

## 2016-01-24 LAB — URINE CYTOLOGY ANCILLARY ONLY
CHLAMYDIA, DNA PROBE: NEGATIVE
NEISSERIA GONORRHEA: NEGATIVE

## 2016-01-30 LAB — HIV-1 INTEGRASE GENOTYPE

## 2016-02-05 ENCOUNTER — Ambulatory Visit (INDEPENDENT_AMBULATORY_CARE_PROVIDER_SITE_OTHER): Payer: Self-pay | Admitting: Internal Medicine

## 2016-02-05 VITALS — BP 104/71 | HR 60 | Temp 98.0°F | Wt 170.0 lb

## 2016-02-05 DIAGNOSIS — B20 Human immunodeficiency virus [HIV] disease: Secondary | ICD-10-CM

## 2016-02-05 DIAGNOSIS — A749 Chlamydial infection, unspecified: Secondary | ICD-10-CM

## 2016-02-05 DIAGNOSIS — B360 Pityriasis versicolor: Secondary | ICD-10-CM

## 2016-02-05 MED ORDER — FLUCONAZOLE 100 MG PO TABS: 100.0000 mg | ORAL_TABLET | ORAL | Status: DC

## 2016-02-05 MED ORDER — FLUCONAZOLE 100 MG PO TABS: 300.0000 mg | ORAL_TABLET | ORAL | Status: DC

## 2016-02-05 MED ORDER — ELVITEG-COBIC-EMTRICIT-TENOFAF 150-150-200-10 MG PO TABS: 1.0000 | ORAL_TABLET | Freq: Every day | ORAL | Status: DC

## 2016-02-06 NOTE — Assessment & Plan Note (Signed)
His genotype and integrase resistance assays indicate wild type virus without resistance mutations. I discussed antiretroviral options with him again and he will start on Genvoya once daily with breakfast. He will followup after lab work in 6 weeks.

## 2016-02-06 NOTE — Assessment & Plan Note (Signed)
His followup Chlamydia and GC screens are negative indicating that he was cured by recent therapy for Chlamydia. I talked to him again about the utmost importance of future partners collection, limiting the number of partners he has and corrected consistent use of condoms.

## 2016-02-06 NOTE — Assessment & Plan Note (Signed)
He appears to have tinea versicolor. I will treat him with oral fluconazole.

## 2016-02-06 NOTE — Progress Notes (Signed)
Patient Active Problem List   Diagnosis Date Noted  . HIV disease (HCC) 07/23/2011    Priority: High  . Chlamydia 01/22/2016    Priority: Medium  . History of gonorrhea 01/22/2016    Priority: Medium  . 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 Former smoker 08/23/2014  . Encounter for long-term (current) use of medications 08/22/2014  . Tinea versicolor 08/22/2014  . Healthcare maintenance 09/05/2013  . Seasonal allergies 09/06/2012    Patient's Medications  New Prescriptions   ELVITEGRAVIR-COBICISTAT-EMTRICITABINE-TENOFOVIR (GENVOYA) 150-150-200-10 MG TABS TABLET    Take 1 tablet by mouth daily with breakfast.   FLUCONAZOLE (DIFLUCAN) 100 MG TABLET    Take 3 tablets (300 mg total) by mouth once a week.  Previous Medications   No medications on file  Modified Medications   No medications on file  Discontinued Medications   AZITHROMYCIN (ZITHROMAX) 500 MG TABLET    Take 2 tablets (1,000 mg total) by mouth daily.   ELVITEGRAVIR-COBICISTAT-EMTRICITABINE-TENOFOVIR (STRIBILD) 150-150-200-300 MG TABS TABLET    Take 1 tablet by mouth daily with breakfast.   KETOCONAZOLE (NIZORAL) 2 % SHAMPOO    Apply 1 application topically daily. Apply for 5-7 days for 5 minutes then take a shower (at least do 5 days in a row)   PSEUDOEPHEDRINE-ACETAMINOPHEN (TYLENOL SINUS) 30-500 MG TABS    Take 2 tablets by mouth at bedtime. Reported on 01/22/2016    Subjective: Paul Palmer is in for his routine HIV followup visit. He is doing well and without complaint. He is eager to learn of his test results we can restart therapy.  Review of Systems: Review of Systems  Constitutional: Negative for fever, chills, weight loss, malaise/fatigue and diaphoresis.  HENT: Negative for sore throat.   Respiratory: Negative for cough, sputum production and shortness of breath.   Cardiovascular: Negative for chest pain.  Gastrointestinal: Negative for nausea, vomiting and diarrhea.    Genitourinary: Negative for dysuria.  Musculoskeletal: Negative for myalgias and joint pain.  Skin: Negative for rash.  Neurological: Negative for dizziness and headaches.  Psychiatric/Behavioral: Negative for depression and substance abuse. The patient is not nervous/anxious.     Past Medical History  Diagnosis Date  . HIV (human immunodeficiency virus infection) (HCC)     Social History  Substance Use Topics  . Smoking status: Former Smoker -- 0.25 packs/day for 9 years    Types: Cigarettes    Start date: 08/22/2005  . Smokeless tobacco: Never Used     Comment: congratulated!!!  . Alcohol Use: 0.0 oz/week    0 Standard drinks or equivalent per week     Comment: occ    Family History  Problem Relation Age of Onset  . Hypertension Mother     and grandparents  . Diabetes      grandparents (deceased)    No Known Allergies  Objective:  Filed Vitals:   02/05/16 1509  BP: 104/71  Pulse: 60  Temp: 98 F (36.7 C)  TempSrc: Oral  Weight: 170 lb (77.111 kg)   Body mass index is 20.7 kg/(m^2).  Physical Exam  Constitutional: He is oriented to person, place, and time.  HENT:  Mouth/Throat: No oropharyngeal exudate.  Eyes: Conjunctivae are normal.  Cardiovascular: Normal rate and regular rhythm.   No murmur heard. Pulmonary/Chest: Breath sounds normal.  Abdominal: Soft. He exhibits no mass. There is no tenderness.  Musculoskeletal: Normal range of motion.  Neurological: He is alert and  oriented to person, place, and time.  Skin: Rash noted.  He has some scaling in his scalp and more prominent circular skin lesions with a slightly raised silvery border. They're mildly pruritic.  Psychiatric: Mood and affect normal.    Lab Results Lab Results  Component Value Date   WBC 3.3* 12/30/2015   HGB 16.0 12/30/2015   HCT 45.5 12/30/2015   MCV 91.0 12/30/2015   PLT 289 12/30/2015    Lab Results  Component Value Date   CREATININE 0.84 12/30/2015   BUN 8 12/30/2015    NA 138 12/30/2015   K 4.7 12/30/2015   CL 103 12/30/2015   CO2 26 12/30/2015    Lab Results  Component Value Date   ALT 14 12/30/2015   AST 16 12/30/2015   ALKPHOS 52 12/30/2015   BILITOT 0.8 12/30/2015    Lab Results  Component Value Date   CHOL 141 09/20/2014   HDL 33* 09/20/2014   LDLCALC 92 09/20/2014   TRIG 78 09/20/2014   CHOLHDL 4.3 09/20/2014    Lab Results HIV 1 RNA QUANT (copies/mL)  Date Value  12/30/2015 2951*  09/20/2014 <20  08/02/2013 <20   CD4 T CELL ABS (/uL)  Date Value  12/30/2015 540  09/20/2014 480  07/31/2014 570      Problem List Items Addressed This Visit      High   HIV disease (HCC)    His genotype and integrase resistance assays indicate wild type virus without resistance mutations. I discussed antiretroviral options with him again and he will start on Genvoya once daily with breakfast. He will followup after lab work in 6 weeks.      Relevant Medications   elvitegravir-cobicistat-emtricitabine-tenofovir (GENVOYA) 150-150-200-10 MG TABS tablet   fluconazole (DIFLUCAN) 100 MG tablet   Other Relevant Orders   T-helper cell (CD4)- ([facility] clinic only)   HIV 1 RNA quant-no reflex-bld   CBC   Comprehensive metabolic panel     Medium   Chlamydia - Primary    His followup Chlamydia and GC screens are negative indicating that he was cured by recent therapy for Chlamydia. I talked to him again about the utmost importance of future partners collection, limiting the number of partners he has and corrected consistent use of condoms.      Relevant Medications   elvitegravir-cobicistat-emtricitabine-tenofovir (GENVOYA) 150-150-200-10 MG TABS tablet   fluconazole (DIFLUCAN) 100 MG tablet     Unprioritized   Tinea versicolor    He appears to have tinea versicolor. I will treat him with oral fluconazole.      Relevant Medications   fluconazole (DIFLUCAN) 100 MG tablet        Cliffton Asters, MD [facility] for Infectious  Disease [facility] [phone] pager   [phone] cell 02/06/2016, 9:25 AM

## 2016-02-28 ENCOUNTER — Other Ambulatory Visit: Payer: Self-pay | Admitting: Internal Medicine

## 2016-03-24 ENCOUNTER — Other Ambulatory Visit (INDEPENDENT_AMBULATORY_CARE_PROVIDER_SITE_OTHER): Payer: Self-pay

## 2016-03-24 DIAGNOSIS — B20 Human immunodeficiency virus [HIV] disease: Secondary | ICD-10-CM

## 2016-03-24 LAB — COMPREHENSIVE METABOLIC PANEL
ALK PHOS: 44 U/L (ref 40–115)
ALT: 9 U/L (ref 9–46)
AST: 12 U/L (ref 10–40)
Albumin: 4.3 g/dL (ref 3.6–5.1)
BUN: 10 mg/dL (ref 7–25)
CO2: 25 mmol/L (ref 20–31)
CREATININE: 0.86 mg/dL (ref 0.60–1.35)
Calcium: 9.1 mg/dL (ref 8.6–10.3)
Chloride: 104 mmol/L (ref 98–110)
Glucose, Bld: 87 mg/dL (ref 65–99)
Potassium: 4.2 mmol/L (ref 3.5–5.3)
SODIUM: 137 mmol/L (ref 135–146)
TOTAL PROTEIN: 7.3 g/dL (ref 6.1–8.1)
Total Bilirubin: 0.9 mg/dL (ref 0.2–1.2)

## 2016-03-24 LAB — CBC
HCT: 45.4 % (ref 38.5–50.0)
Hemoglobin: 15.8 g/dL (ref 13.2–17.1)
MCH: 32.2 pg (ref 27.0–33.0)
MCHC: 34.8 g/dL (ref 32.0–36.0)
MCV: 92.7 fL (ref 80.0–100.0)
MPV: 9.5 fL (ref 7.5–12.5)
PLATELETS: 256 10*3/uL (ref 140–400)
RBC: 4.9 MIL/uL (ref 4.20–5.80)
RDW: 14.2 % (ref 11.0–15.0)
WBC: 3.7 10*3/uL — ABNORMAL LOW (ref 3.8–10.8)

## 2016-03-25 LAB — T-HELPER CELL (CD4) - (RCID CLINIC ONLY)
CD4 T CELL HELPER: 31 % — AB (ref 33–55)
CD4 T Cell Abs: 590 /uL (ref 400–2700)

## 2016-03-25 LAB — HIV-1 RNA QUANT-NO REFLEX-BLD
HIV 1 RNA Quant: 52 copies/mL — ABNORMAL HIGH (ref <20)
HIV-1 RNA Quant, Log: 1.72 Log copies/mL — ABNORMAL HIGH (ref <1.30)

## 2016-04-07 ENCOUNTER — Ambulatory Visit (INDEPENDENT_AMBULATORY_CARE_PROVIDER_SITE_OTHER): Payer: Self-pay | Admitting: Internal Medicine

## 2016-04-07 ENCOUNTER — Other Ambulatory Visit: Payer: Self-pay | Admitting: Internal Medicine

## 2016-04-07 ENCOUNTER — Encounter: Payer: Self-pay | Admitting: Internal Medicine

## 2016-04-07 VITALS — Ht 76.0 in | Wt 167.0 lb

## 2016-04-07 DIAGNOSIS — B360 Pityriasis versicolor: Secondary | ICD-10-CM

## 2016-04-07 DIAGNOSIS — B20 Human immunodeficiency virus [HIV] disease: Secondary | ICD-10-CM

## 2016-04-07 NOTE — Assessment & Plan Note (Signed)
Patient states he was able to take Fluconazole every Saturday for 3-4 weeks.  States his rash resolved and he discontinued the medication.  Today, he denies rash and pruritis.

## 2016-04-07 NOTE — Progress Notes (Signed)
   Subjective:    Patient ID: Chesley NoonMarcell England, male    DOB: 12/11/1985, 30 y.o.   MRN: [id number]  HPI Patient is a 30 year old male diagnosed with HIV who was last seen 02/05/15.  Patient's ART regimen consist of Genvoya, which he reports taking with food most of the time; unfortunately, he does not always have a meal with medication.  He reports tolerating medications well, denies any side effects, and denies any missed doses.    Patient seen in clinic today for routine follow-up and denies any chief complaint at this time.  Review of Systems  Constitutional: Negative for fever and appetite change.  HENT: Negative for mouth sores and sore throat.   Respiratory: Negative for cough and shortness of breath.   Cardiovascular: Negative for chest pain.  Gastrointestinal: Negative for abdominal pain, diarrhea and constipation.  Genitourinary: Negative for dysuria and discharge.  Skin: Negative for rash.  Neurological: Negative for headaches.      Objective:   Physical Exam  Constitutional: He is oriented to person, place, and time. He appears well-developed and well-nourished.  HENT:  Mouth/Throat: No oropharyngeal exudate.  Eyes: Pupils are equal, round, and reactive to light.  Cardiovascular: Normal rate, regular rhythm and normal heart sounds.   No murmur heard. Pulmonary/Chest: Breath sounds normal. No respiratory distress. He has no wheezes.  Abdominal: Soft. Bowel sounds are normal. He exhibits no distension. There is no tenderness.  Lymphadenopathy:    He has no cervical adenopathy.  Neurological: He is alert and oriented to person, place, and time.  Skin: Skin is warm and dry. No rash noted.     Psychiatric: He has a normal mood and affect.       Assessment & Plan:

## 2016-04-07 NOTE — Assessment & Plan Note (Addendum)
Patient seen in clinic for routine follow-up.  Patient's VL and CD4 results discussed.  Patient ecstatic that his VL has been dramatically reducing.  Patient commended for his adherence and encouraged to continue with compliance.  Additionally, patient educated on the importance of taking medication with food.  Discussed the option of timing his medication with meal times.  Patient verbalized understanding and states he will adjust the timing.  Return to clinic in 3 months with labs prior.  Addendum: I have seen and examined Paul Palmer today with Pershing CoxJose Ramon Markcus Lazenby, RN. Marcelll is doing well with his Genvoya. We talked him about the importance of taking it with food on a consistent basis. He does not believe that will be a problem. He is not missing doses. He is currently not sexually active. I talked him about being very careful about future partner selection and condom use. He will follow-up after lab work in 3 months. .Marland Kitchen

## 2016-04-22 IMAGING — CR DG CHEST 2V
2 series · 2 of 2 positions shown · non-contrast
Comparison: None.

CLINICAL DATA: Cough, fever and body aches for 72 hr.

EXAM:
CHEST  2 VIEW

[w chest pa]
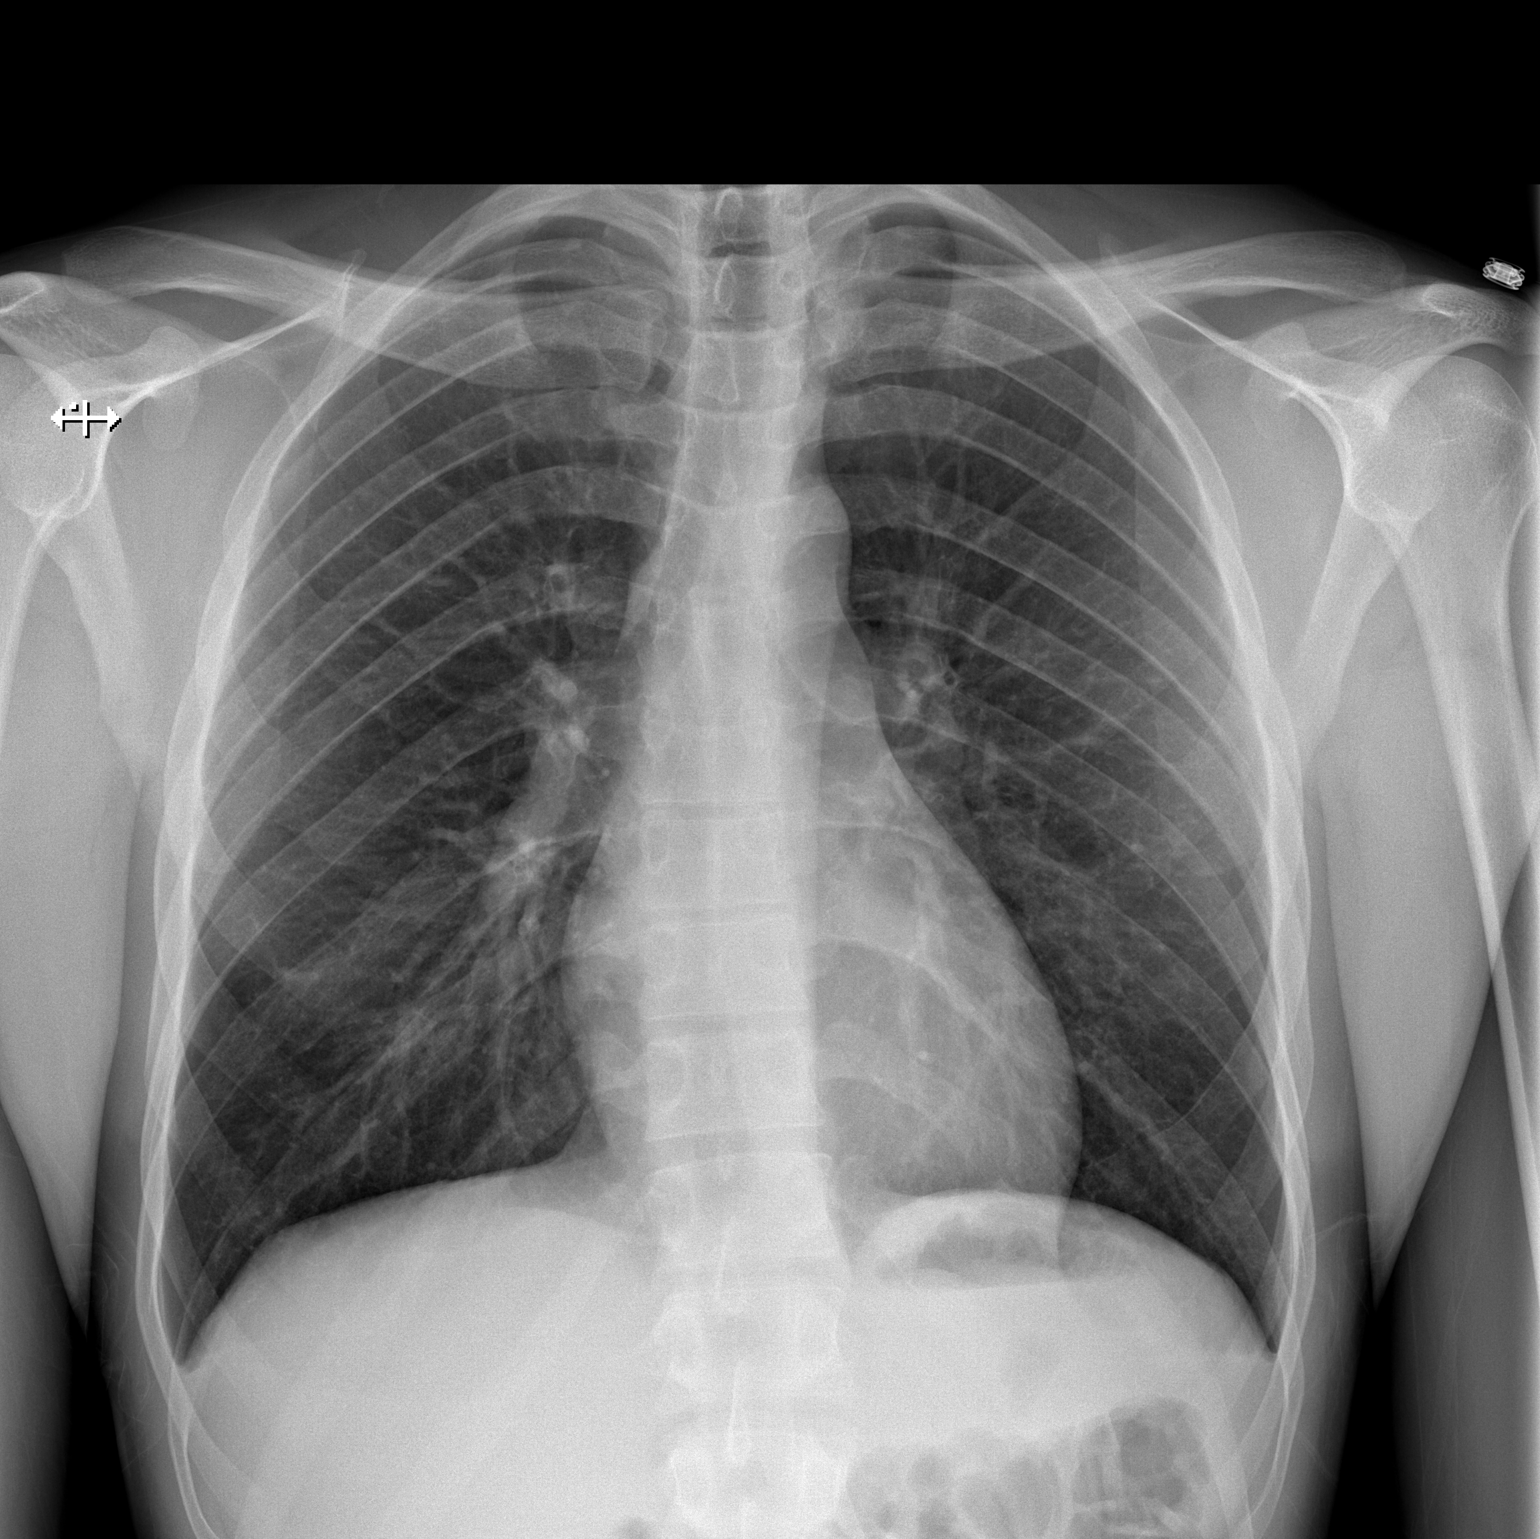

[w chest lat]
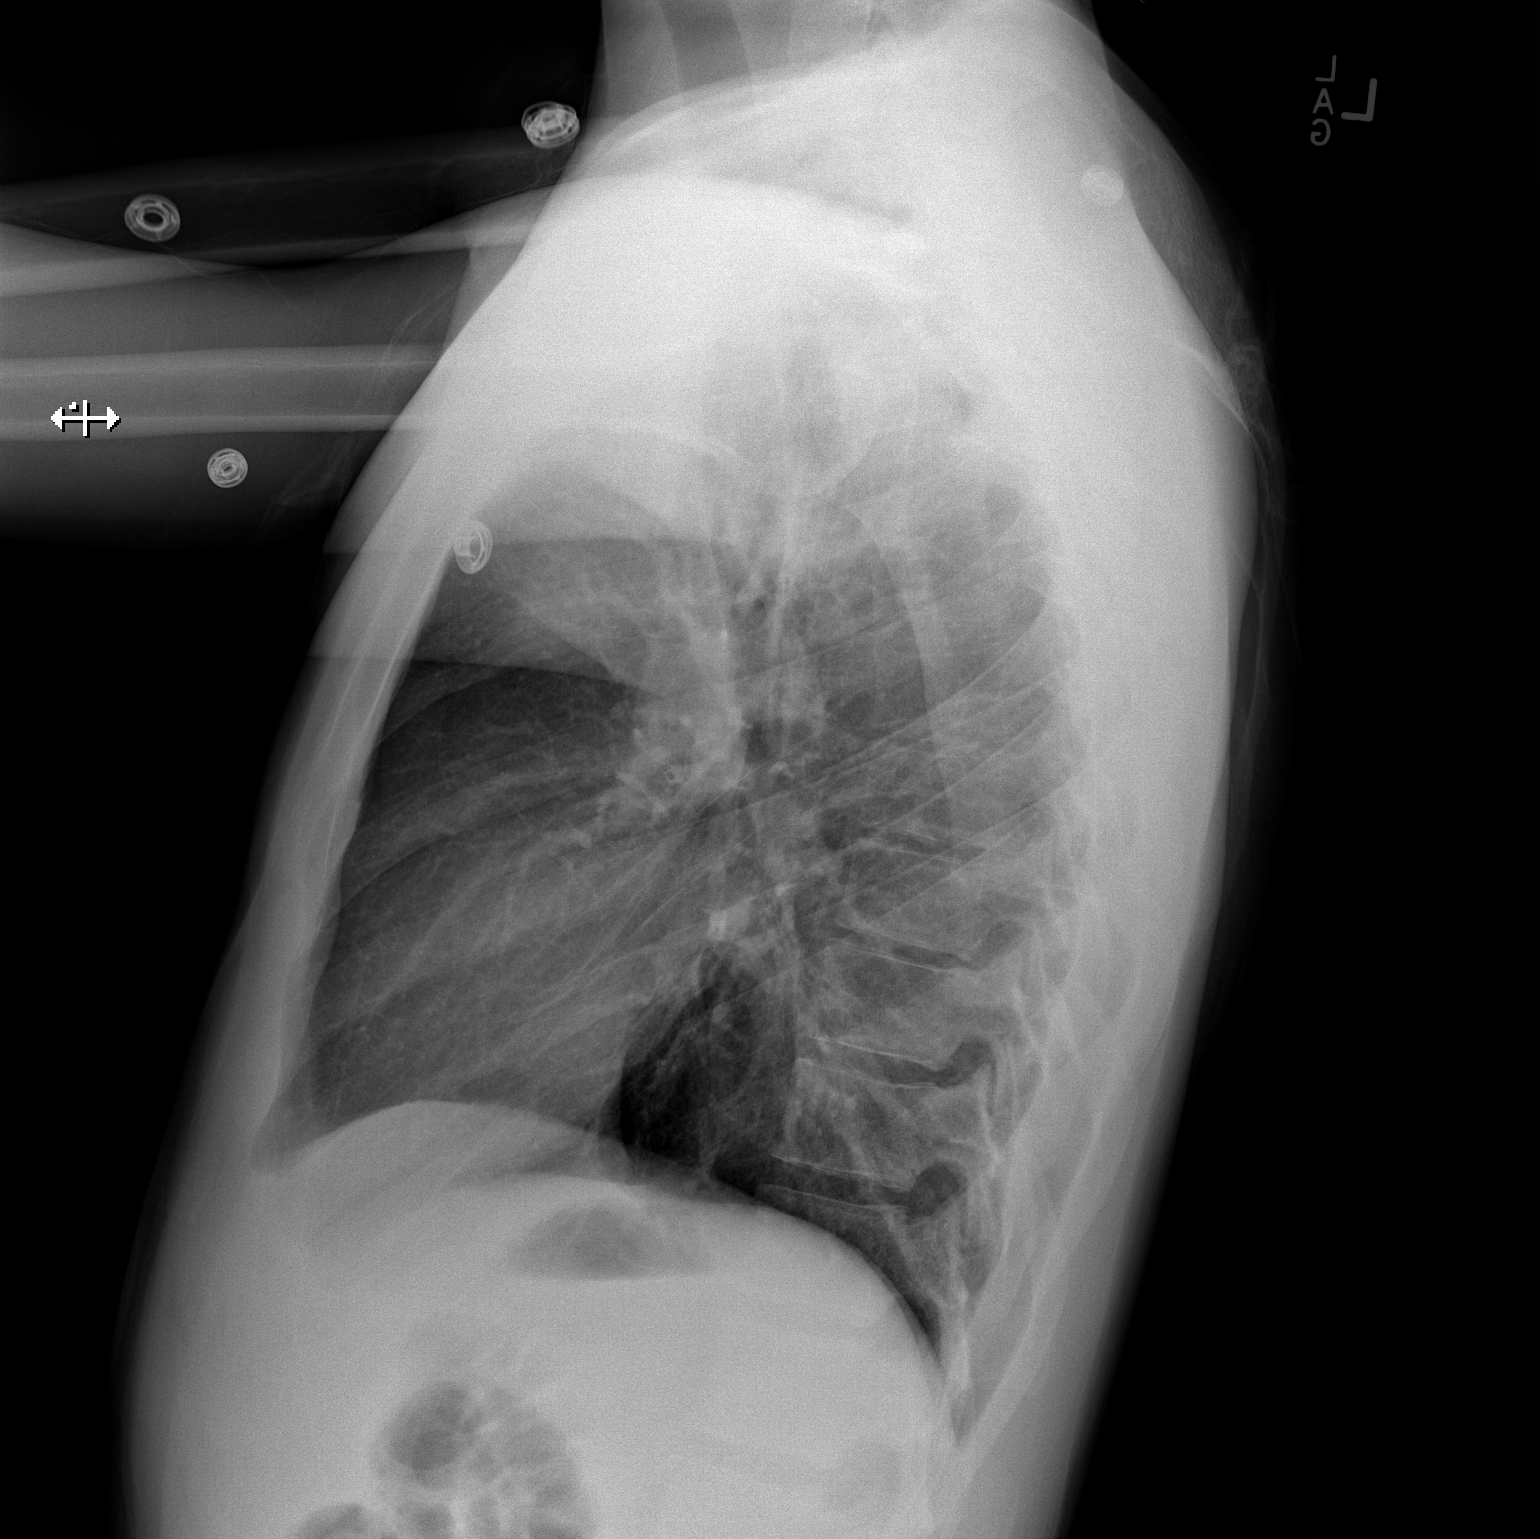

[2 of 2 positions shown; findings below may reference images not displayed]

FINDINGS: The heart size and mediastinal contours are within normal limits.
Both lungs are clear. The visualized skeletal structures are
unremarkable.
IMPRESSION: No active cardiopulmonary disease.

## 2016-06-25 ENCOUNTER — Other Ambulatory Visit: Payer: Self-pay

## 2016-07-09 ENCOUNTER — Telehealth: Payer: Self-pay | Admitting: *Deleted

## 2016-07-09 ENCOUNTER — Ambulatory Visit: Payer: Self-pay | Admitting: Internal Medicine

## 2016-07-09 NOTE — Telephone Encounter (Signed)
Left message to call [facility] for a new appointment with Dr. Orvan Falconerampbell.

## 2016-08-03 ENCOUNTER — Other Ambulatory Visit: Payer: Self-pay

## 2016-08-18 ENCOUNTER — Ambulatory Visit: Payer: Self-pay | Admitting: Internal Medicine

## 2018-08-10 ENCOUNTER — Emergency Department (HOSPITAL_COMMUNITY)
Admission: EM | Admit: 2018-08-10 | Discharge: 2018-08-10 | Disposition: A | Discharge status: Home / Self Care | Payer: Self-pay | Attending: Emergency Medicine | Admitting: Emergency Medicine

## 2018-08-10 ENCOUNTER — Encounter (HOSPITAL_COMMUNITY): Payer: Self-pay | Admitting: Emergency Medicine

## 2018-08-10 ENCOUNTER — Other Ambulatory Visit: Payer: Self-pay

## 2018-08-10 DIAGNOSIS — Z21 Asymptomatic human immunodeficiency virus [HIV] infection status: Secondary | ICD-10-CM | POA: Insufficient documentation

## 2018-08-10 DIAGNOSIS — K529 Noninfective gastroenteritis and colitis, unspecified: Secondary | ICD-10-CM | POA: Insufficient documentation

## 2018-08-10 DIAGNOSIS — Z79899 Other long term (current) drug therapy: Secondary | ICD-10-CM | POA: Insufficient documentation

## 2018-08-10 DIAGNOSIS — Z87891 Personal history of nicotine dependence: Secondary | ICD-10-CM | POA: Insufficient documentation

## 2018-08-10 LAB — COMPREHENSIVE METABOLIC PANEL
ALT: 14 U/L (ref 0–44)
ANION GAP: 11 (ref 5–15)
AST: 18 U/L (ref 15–41)
Albumin: 3.9 g/dL (ref 3.5–5.0)
Alkaline Phosphatase: 43 U/L (ref 38–126)
BUN: 8 mg/dL (ref 6–20)
CO2: 27 mmol/L (ref 22–32)
CREATININE: 0.99 mg/dL (ref 0.61–1.24)
Calcium: 9.1 mg/dL (ref 8.9–10.3)
Chloride: 100 mmol/L (ref 98–111)
Glucose, Bld: 99 mg/dL (ref 70–99)
POTASSIUM: 3.6 mmol/L (ref 3.5–5.1)
Sodium: 138 mmol/L (ref 135–145)
Total Bilirubin: 1 mg/dL (ref 0.3–1.2)
Total Protein: 7.7 g/dL (ref 6.5–8.1)

## 2018-08-10 LAB — TYPE AND SCREEN
ABO/RH(D): A POS
ANTIBODY SCREEN: NEGATIVE

## 2018-08-10 LAB — CBC
HEMATOCRIT: 46 % (ref 39.0–52.0)
Hemoglobin: 16.2 g/dL (ref 13.0–17.0)
MCH: 31.3 pg (ref 26.0–34.0)
MCHC: 35.2 g/dL (ref 30.0–36.0)
MCV: 89 fL (ref 80.0–100.0)
NRBC: 0 % (ref 0.0–0.2)
Platelets: 276 10*3/uL (ref 150–400)
RBC: 5.17 MIL/uL (ref 4.22–5.81)
RDW: 12.2 % (ref 11.5–15.5)
WBC: 3.7 10*3/uL — AB (ref 4.0–10.5)

## 2018-08-10 LAB — LIPASE, BLOOD: Lipase: 34 U/L (ref 11–51)

## 2018-08-10 LAB — ABO/RH: ABO/RH(D): A POS

## 2018-08-10 MED ORDER — ONDANSETRON 8 MG PO TBDP: 8.0000 mg | ORAL_TABLET | Freq: Three times a day (TID) | ORAL | 0 refills | Status: DC | PRN

## 2018-08-10 MED ORDER — SODIUM CHLORIDE 0.9 % IV BOLUS (SEPSIS)
500.0000 mL | Freq: Once | INTRAVENOUS | Status: AC
  Administered 2018-08-10: 500 mL via INTRAVENOUS

## 2018-08-10 MED ORDER — ONDANSETRON HCL 4 MG/2ML IJ SOLN
4.0000 mg | Freq: Once | INTRAMUSCULAR | Status: AC
  Administered 2018-08-10: 4 mg via INTRAVENOUS
  Filled 2018-08-10: qty 2

## 2018-08-10 MED ORDER — ONDANSETRON 4 MG PO TBDP: 4.0000 mg | ORAL_TABLET | Freq: Once | ORAL | Status: DC | PRN

## 2018-08-10 MED ORDER — SODIUM CHLORIDE 0.9 % IV SOLN
1000.0000 mL | INTRAVENOUS | Status: DC
  Administered 2018-08-10: 1000 mL via INTRAVENOUS

## 2018-08-10 MED ORDER — LOPERAMIDE HCL 2 MG PO CAPS: 2.0000 mg | ORAL_CAPSULE | Freq: Four times a day (QID) | ORAL | 0 refills | Status: DC | PRN

## 2018-08-10 NOTE — Discharge Instructions (Signed)
Take the medications as prescribed for the nausea vomiting diarrhea, please follow-up with your infectious disease doctor start back on your medications, return for fever worsening symptoms

## 2018-08-10 NOTE — ED Triage Notes (Signed)
Patient is complaining of diarrhea, fever, hot and cold, chills, nausea, and vomiting. Patient states this started Sunday. Patient has not had an appetite. Patient states that his stool is black.

## 2018-08-10 NOTE — ED Notes (Signed)
Urine sample and culture sent to lab 

## 2018-08-10 NOTE — ED Notes (Signed)
Pt ambulated to the bathroom without any assistance

## 2018-08-10 NOTE — ED Provider Notes (Signed)
[facility]-EMERGENCY DEPT Provider Note   CSN: [id number] Arrival date & time: 08/10/18  7829     History   Chief Complaint Chief Complaint  Patient presents with  . Diarrhea  . Nausea    HPI Paul Palmer is a 32 y.o. male.  HPI Patient presents to the emergency room for evaluation of diarrhea.  Patient states he started having multiple episodes of diarrhea today.  He started to have some trouble with fevers and chills as well as nausea.  He had one episode of vomiting.  The symptoms started initially on Sunday.  He has not had much of an appetite.  Patient thought it was a stomach virus but today he noticed that his stool was dark in color.  He read on Google that this could be a serious problem so he came to the emergency room.  Patient has been taking over-the-counter medications including Kaopectate. Pt has history of HIV disease.    Currently on Genvoya well controlled.   Past Medical History:  Diagnosis Date  . HIV (human immunodeficiency virus infection) [facility])     Patient Active Problem List   Diagnosis Date Noted  . Chlamydia 01/22/2016  . History of gonorrhea 01/22/2016  . Former smoker 08/23/2014  . Encounter for long-term (current) use of medications 08/22/2014  . Tinea versicolor 08/22/2014  . 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 Healthcare maintenance 09/05/2013  . Seasonal allergies 09/06/2012  . HIV disease (HCC) 07/23/2011    Past Surgical History:  Procedure Laterality Date  . none          Home Medications    Prior to Admission medications   Medication Sig Start Date End Date Taking? Authorizing Provider  Bismuth Subsalicylate (KAOPECTATE PO) Take 2 tablets by mouth 3 (three) times daily as needed (diarrhea).   Yes [provider]  ibuprofen (ADVIL,MOTRIN) 200 MG tablet Take 800 mg by mouth daily as needed for fever.   Yes [provider]  elvitegravir-cobicistat-emtricitabine-tenofovir (GENVOYA)  150-150-200-10 MG TABS tablet Take 1 tablet by mouth daily with breakfast. Patient not taking: Reported on 08/10/2018 02/05/16   Cliffton Asters, MD  fluconazole (DIFLUCAN) 100 MG tablet Take 3 tablets (300 mg total) by mouth once a week. Patient not taking: Reported on 04/07/2016 02/05/16   Cliffton Asters, MD  loperamide (IMODIUM) 2 MG capsule Take 1 capsule (2 mg total) by mouth 4 (four) times daily as needed for diarrhea or loose stools. 08/10/18   Linwood Dibbles, MD  ondansetron (ZOFRAN ODT) 8 MG disintegrating tablet Take 1 tablet (8 mg total) by mouth every 8 (eight) hours as needed for nausea or vomiting. 08/10/18   Linwood Dibbles, MD    Family History Family History  Problem Relation Age of Onset  . Hypertension Mother        and grandparents  . Diabetes Unknown        grandparents (deceased)    Social History Social History   Tobacco Use  . Smoking status: Former Smoker    Packs/day: 0.25    Years: 9.00    Pack years: 2.25    Types: Cigarettes    Start date: 08/22/2005  . Smokeless tobacco: Never Used  . Tobacco comment: congratulated!!!  Substance Use Topics  . Alcohol use: Yes    Alcohol/week: 0.0 standard drinks    Comment: occ  . Drug use: Yes    Frequency: 7.0 times per week    Types: Marijuana     Allergies  Patient has no known allergies.   Review of Systems Review of Systems  All other systems reviewed and are negative.    Physical Exam Updated Vital Signs BP 130/85   Pulse 70   Temp 99.4 F (37.4 C) (Oral)   Resp 13   Ht 1.93 m (6\' 4" )   Wt 78.9 kg   SpO2 100%   BMI 21.18 kg/m   Physical Exam  Constitutional: He appears well-developed and well-nourished. No distress.  HENT:  Head: Normocephalic and atraumatic.  Right Ear: External ear normal.  Left Ear: External ear normal.  Eyes: Conjunctivae are normal. Right eye exhibits no discharge. Left eye exhibits no discharge. No scleral icterus.  Neck: Neck supple. No tracheal deviation present.    Cardiovascular: Normal rate, regular rhythm and intact distal pulses.  Pulmonary/Chest: Effort normal and breath sounds normal. No stridor. No respiratory distress. He has no wheezes. He has no rales.  Abdominal: Soft. Bowel sounds are normal. He exhibits no distension. There is no tenderness. There is no rebound and no guarding.  Musculoskeletal: He exhibits no edema or tenderness.  Neurological: He is alert. He has normal strength. No cranial nerve deficit (no facial droop, extraocular movements intact, no slurred speech) or sensory deficit. He exhibits normal muscle tone. He displays no seizure activity. Coordination normal.  Skin: Skin is warm and dry. No rash noted.  Psychiatric: He has a normal mood and affect.  Nursing note and vitals reviewed.    ED Treatments / Results  Labs (all labs ordered are listed, but only abnormal results are displayed) Labs Reviewed  CBC - Abnormal; Notable for the following components:      Result Value   WBC 3.7 (*)    All other components within normal limits  LIPASE, BLOOD  COMPREHENSIVE METABOLIC PANEL  TYPE AND SCREEN  ABO/RH    EKG None  Radiology No results found.  Procedures Procedures (including critical care time)  Medications Ordered in ED Medications  ondansetron (ZOFRAN-ODT) disintegrating tablet 4 mg (has no administration in time range)  sodium chloride 0.9 % bolus 500 mL (0 mLs Intravenous Stopped 08/10/18 2254)    Followed by  0.9 %  sodium chloride infusion (1,000 mLs Intravenous New Bag/Given 08/10/18 2041)  ondansetron (ZOFRAN) injection 4 mg (4 mg Intravenous Given 08/10/18 2041)     Initial Impression / Assessment and Plan / ED Course  I have reviewed the triage vital signs and the nursing notes.  Pertinent labs & imaging results that were available during my care of the patient were reviewed by me and considered in my medical decision making (see chart for details).  Clinical Course as of Aug 11 2255  Wed Aug 10, 2018  2256 Labs reviewed.  Normal.   [JK]    Clinical Course User Index [JK] Linwood Dibbles, MD  Patient presented to the emergency room with complaints of vomiting and diarrhea.  Patient was concerned he might have been having internal bleeding because of the dark stools but his hemoglobin is normal and I think this is related to the Kaopectate.  Patient most likely has a viral illness.  Plan on discharge home with antinausea medication antidiarrhea medication.  Patient has a history of HIV disease.  He has not been taking his medications and has not followed up with his infectious disease doctor.  He does not appear toxic and I think it safe for him to follow-up as an outpatient but I encouraged him to follow-up with his infectious  disease clinic.  Final Clinical Impressions(s) / ED Diagnoses   Final diagnoses:  Gastroenteritis    ED Discharge Orders         Ordered    ondansetron (ZOFRAN ODT) 8 MG disintegrating tablet  Every 8 hours PRN,   Status:  Discontinued     08/10/18 2249    loperamide (IMODIUM) 2 MG capsule  4 times daily PRN     08/10/18 2249    ondansetron (ZOFRAN ODT) 8 MG disintegrating tablet  Every 8 hours PRN     08/10/18 2253           Linwood Dibbles, MD 08/10/18 2256

## 2018-08-11 ENCOUNTER — Telehealth: Payer: Self-pay

## 2018-08-11 NOTE — Telephone Encounter (Signed)
Called patient to schedule a follow-up appointment with Dr. Orvan Falconer. Unable to reach patient at this moment. Left voicemail asking patient to call office.  Paul Palmer, [place]

## 2018-12-25 ENCOUNTER — Other Ambulatory Visit: Payer: Self-pay

## 2018-12-25 ENCOUNTER — Encounter (HOSPITAL_COMMUNITY): Payer: Self-pay | Admitting: Emergency Medicine

## 2018-12-25 ENCOUNTER — Emergency Department (HOSPITAL_COMMUNITY)
Admission: EM | Admit: 2018-12-25 | Discharge: 2018-12-25 | Disposition: A | Discharge status: Home / Self Care | Payer: Self-pay | Attending: Emergency Medicine | Admitting: Emergency Medicine

## 2018-12-25 DIAGNOSIS — H019 Unspecified inflammation of eyelid: Secondary | ICD-10-CM

## 2018-12-25 DIAGNOSIS — Z79899 Other long term (current) drug therapy: Secondary | ICD-10-CM | POA: Insufficient documentation

## 2018-12-25 DIAGNOSIS — H01114 Allergic dermatitis of left upper eyelid: Secondary | ICD-10-CM | POA: Insufficient documentation

## 2018-12-25 DIAGNOSIS — Z87891 Personal history of nicotine dependence: Secondary | ICD-10-CM | POA: Insufficient documentation

## 2018-12-25 MED ORDER — SULFAMETHOXAZOLE-TRIMETHOPRIM 800-160 MG PO TABS
1.0000 | ORAL_TABLET | Freq: Once | ORAL | Status: AC
  Administered 2018-12-25: 1 via ORAL
  Filled 2018-12-25: qty 1

## 2018-12-25 MED ORDER — SULFAMETHOXAZOLE-TRIMETHOPRIM 800-160 MG PO TABS: 1.0000 | ORAL_TABLET | Freq: Two times a day (BID) | ORAL | 0 refills | Status: AC

## 2018-12-25 NOTE — ED Notes (Signed)
Bed: WA04 Expected date:  Expected time:  Means of arrival:  Comments: 

## 2018-12-25 NOTE — Discharge Instructions (Addendum)
As discussed, your evaluation today has been largely reassuring.  But, it is important that you monitor your condition carefully, and do not hesitate to return to the ED if you develop new, or concerning changes in your condition. In addition to the prescribed antibiotics please use warm compresses, 4 times daily for additional healing.  Otherwise, please follow-up with your physician for appropriate ongoing care.

## 2018-12-25 NOTE — ED Triage Notes (Signed)
Pt c/o L eye swelling. Pt states he had a pimple like sore above L eyebrow which recently started draining and now entire L eye is swollen, minor pressure to L eye.

## 2018-12-25 NOTE — ED Provider Notes (Signed)
[facility]-EMERGENCY DEPT Provider Note   CSN: [id number] Arrival date & time: 12/25/18  3235    History   Chief Complaint Chief Complaint  Patient presents with  . Facial Swelling    HPI Paul Palmer is a 33 y.o. male.     HPI Patient presents with concern of a left facial lesion. Patient has HIV, but notes that his numbers have been good, denies other systemic complaints. He notes that recently has noticed a small bump on the left lateral superior eyelid. This was essentially unremarkable until this morning, when he awoke with purulent drainage from the lesion, and swelling about the eye. He denies visual changes, fever, headache or other complaints. There is some soreness about the eye, moderate. No medication taken for relief this morning. Past Medical History:  Diagnosis Date  . HIV (human immunodeficiency virus infection) [facility])     Patient Active Problem List   Diagnosis Date Noted  . Chlamydia 01/22/2016  . History of gonorrhea 01/22/2016  . Former smoker 08/23/2014  . Encounter for long-term (current) use of medications 08/22/2014  . Tinea versicolor 08/22/2014  . 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 Healthcare maintenance 09/05/2013  . Seasonal allergies 09/06/2012  . HIV disease (HCC) 07/23/2011    Past Surgical History:  Procedure Laterality Date  . none          Home Medications    Prior to Admission medications   Medication Sig Start Date End Date Taking? Authorizing Provider  Bismuth Subsalicylate (KAOPECTATE PO) Take 2 tablets by mouth 3 (three) times daily as needed (diarrhea).    [provider]  elvitegravir-cobicistat-emtricitabine-tenofovir (GENVOYA) 150-150-200-10 MG TABS tablet Take 1 tablet by mouth daily with breakfast. Patient not taking: Reported on 08/10/2018 02/05/16   Cliffton Asters, MD  fluconazole (DIFLUCAN) 100 MG tablet Take 3 tablets (300 mg total) by mouth once a week. Patient not  taking: Reported on 04/07/2016 02/05/16   Cliffton Asters, MD  ibuprofen (ADVIL,MOTRIN) 200 MG tablet Take 800 mg by mouth daily as needed for fever.    [provider]  loperamide (IMODIUM) 2 MG capsule Take 1 capsule (2 mg total) by mouth 4 (four) times daily as needed for diarrhea or loose stools. 08/10/18   Linwood Dibbles, MD  ondansetron (ZOFRAN ODT) 8 MG disintegrating tablet Take 1 tablet (8 mg total) by mouth every 8 (eight) hours as needed for nausea or vomiting. 08/10/18   Linwood Dibbles, MD    Family History Family History  Problem Relation Age of Onset  . Hypertension Mother        and grandparents  . Diabetes Other        grandparents (deceased)    Social History Social History   Tobacco Use  . Smoking status: Former Smoker    Packs/day: 0.25    Years: 9.00    Pack years: 2.25    Types: Cigarettes    Start date: 08/22/2005  . Smokeless tobacco: Never Used  . Tobacco comment: congratulated!!!  Substance Use Topics  . Alcohol use: Yes    Alcohol/week: 0.0 standard drinks    Comment: occ  . Drug use: Yes    Frequency: 7.0 times per week    Types: Marijuana     Allergies   Patient has no known allergies.   Review of Systems Review of Systems  Constitutional:       Per HPI, otherwise negative  HENT:       Per HPI, otherwise negative  Eyes: Negative for photophobia, pain, discharge, redness, itching and visual disturbance.  Respiratory:       Per HPI, otherwise negative  Cardiovascular:       Per HPI, otherwise negative  Gastrointestinal: Negative for vomiting.  Endocrine:       Negative aside from HPI  Genitourinary:       Neg aside from HPI   Musculoskeletal:       Per HPI, otherwise negative  Skin: Positive for wound.  Allergic/Immunologic: Positive for immunocompromised state.  Neurological: Negative for syncope.     Physical Exam Updated Vital Signs BP 132/88 (BP Location: Right Arm)   Pulse 69   Temp 97.7 F (36.5 C) (Oral)   Resp 16   Ht  6\' 4"  (1.93 m)   Wt 80.3 kg   SpO2 100%   BMI 21.55 kg/m   Physical Exam Vitals signs and nursing note reviewed.  Constitutional:      General: He is not in acute distress.    Appearance: He is well-developed.  HENT:     Head: Normocephalic and atraumatic.  Eyes:     General: No scleral icterus.       Right eye: No discharge.        Left eye: No discharge.     Extraocular Movements: Extraocular movements intact.     Conjunctiva/sclera: Conjunctivae normal.   Cardiovascular:     Rate and Rhythm: Normal rate.  Pulmonary:     Effort: Pulmonary effort is normal. No respiratory distress.     Breath sounds: No stridor.  Abdominal:     General: There is no distension.  Skin:    General: Skin is warm and dry.  Neurological:     Mental Status: He is alert and oriented to person, place, and time.      ED Treatments / Results   Procedures Procedures (including critical care time)  Medications Ordered in ED Medications  sulfamethoxazole-trimethoprim (BACTRIM DS,SEPTRA DS) 800-160 MG per tablet 1 tablet (has no administration in time range)     Initial Impression / Assessment and Plan / ED Course  I have reviewed the triage vital signs and the nursing notes.  Pertinent labs & imaging results that were available during my care of the patient were reviewed by me and considered in my medical decision making (see chart for details).  Young male with HIV, well controlled, presents with left eye cutaneous lesion. As lesion has already drained substantially, has no fluctuance, no indication for additional incision and drainage. No visual changes suggesting orbital cellulitis, no systemic progression. Patient was start a course of Bactrim, perform warm compress therapy several times daily, follow-up with primary care.  Final Clinical Impressions(s) / ED Diagnoses   Final diagnoses:  Infection of eyelid    ED Discharge Orders         Ordered    sulfamethoxazole-trimethoprim  (BACTRIM DS,SEPTRA DS) 800-160 MG tablet  2 times daily     12/25/18 0915           Gerhard Munch, MD 12/25/18 [phone]

## 2019-02-07 ENCOUNTER — Encounter: Payer: Self-pay | Admitting: *Deleted

## 2019-02-07 ENCOUNTER — Telehealth: Payer: Self-pay | Admitting: *Deleted

## 2019-02-07 NOTE — Telephone Encounter (Signed)
Patient recently at [facility], states he is adherent to medication.  He has not been at [facility] since 2017.  RN sent mychart message to patient asking if he is in care with another provider.  Will refer to [facility] for connection to care if he does not reply. Andree Coss, RN

## 2019-02-23 NOTE — Telephone Encounter (Signed)
RN called patient, was able to reach and confirm his identity. RN offered him an appointment to reengage in care, he accepted.  Patient scheduled with Dr Orvan Falconer 4/28. Andree Coss, RN

## 2019-02-27 ENCOUNTER — Telehealth: Payer: Self-pay | Admitting: Internal Medicine

## 2019-02-27 NOTE — Telephone Encounter (Signed)
COVID-19 Pre-Screening Questions: ° °Do you currently have a fever (>100 °F), chills or unexplained body aches? No  ° °Are you currently experiencing new cough, shortness of breath, sore throat, runny nose? no °•  °Have you recently travelled outside the state of [place] in the last 14 days? no °•  °1. Have you been in contact with someone that is currently pending confirmation of Covid19 testing or has been confirmed to have the Covid19 virus?  no ° °

## 2019-02-28 ENCOUNTER — Ambulatory Visit: Payer: Self-pay | Admitting: Internal Medicine

## 2019-02-28 ENCOUNTER — Ambulatory Visit: Payer: Self-pay

## 2019-03-24 ENCOUNTER — Telehealth: Payer: Self-pay | Admitting: *Deleted

## 2019-03-24 NOTE — Telephone Encounter (Signed)
Referred to [facility] counseling. Andree Coss, RN

## 2020-01-22 ENCOUNTER — Encounter: Payer: Self-pay | Admitting: Family Medicine

## 2020-01-29 ENCOUNTER — Inpatient Hospital Stay (HOSPITAL_COMMUNITY)
Admission: EM | Admit: 2020-01-29 | Discharge: 2020-01-31 | DRG: 076 | Disposition: A | Discharge status: Home / Self Care | Payer: Self-pay | Attending: Internal Medicine | Admitting: Internal Medicine

## 2020-01-29 ENCOUNTER — Encounter (HOSPITAL_COMMUNITY): Payer: Self-pay | Admitting: *Deleted

## 2020-01-29 ENCOUNTER — Other Ambulatory Visit: Payer: Self-pay

## 2020-01-29 DIAGNOSIS — G039 Meningitis, unspecified: Secondary | ICD-10-CM

## 2020-01-29 DIAGNOSIS — Z833 Family history of diabetes mellitus: Secondary | ICD-10-CM

## 2020-01-29 DIAGNOSIS — B360 Pityriasis versicolor: Secondary | ICD-10-CM | POA: Diagnosis present

## 2020-01-29 DIAGNOSIS — Z21 Asymptomatic human immunodeficiency virus [HIV] infection status: Secondary | ICD-10-CM | POA: Diagnosis present

## 2020-01-29 DIAGNOSIS — Z87891 Personal history of nicotine dependence: Secondary | ICD-10-CM

## 2020-01-29 DIAGNOSIS — Z8249 Family history of ischemic heart disease and other diseases of the circulatory system: Secondary | ICD-10-CM

## 2020-01-29 DIAGNOSIS — A872 Lymphocytic choriomeningitis: Principal | ICD-10-CM | POA: Diagnosis present

## 2020-01-29 DIAGNOSIS — B20 Human immunodeficiency virus [HIV] disease: Secondary | ICD-10-CM | POA: Diagnosis present

## 2020-01-29 DIAGNOSIS — G43909 Migraine, unspecified, not intractable, without status migrainosus: Secondary | ICD-10-CM | POA: Diagnosis present

## 2020-01-29 DIAGNOSIS — Z20822 Contact with and (suspected) exposure to covid-19: Secondary | ICD-10-CM | POA: Diagnosis present

## 2020-01-29 LAB — CBC
HCT: 47.3 % (ref 39.0–52.0)
Hemoglobin: 17.3 g/dL — ABNORMAL HIGH (ref 13.0–17.0)
MCH: 32.7 pg (ref 26.0–34.0)
MCHC: 36.6 g/dL — ABNORMAL HIGH (ref 30.0–36.0)
MCV: 89.4 fL (ref 80.0–100.0)
Platelets: 273 10*3/uL (ref 150–400)
RBC: 5.29 MIL/uL (ref 4.22–5.81)
RDW: 12.3 % (ref 11.5–15.5)
WBC: 7.7 10*3/uL (ref 4.0–10.5)
nRBC: 0 % (ref 0.0–0.2)

## 2020-01-29 LAB — URINALYSIS, ROUTINE W REFLEX MICROSCOPIC
Bacteria, UA: NONE SEEN
Glucose, UA: NEGATIVE mg/dL
Hgb urine dipstick: NEGATIVE
Ketones, ur: 80 mg/dL — AB
Leukocytes,Ua: NEGATIVE
Nitrite: NEGATIVE
Protein, ur: 100 mg/dL — AB
Specific Gravity, Urine: 1.029 (ref 1.005–1.030)
pH: 7 (ref 5.0–8.0)

## 2020-01-29 LAB — COMPREHENSIVE METABOLIC PANEL
ALT: 14 U/L (ref 0–44)
AST: 18 U/L (ref 15–41)
Albumin: 4.6 g/dL (ref 3.5–5.0)
Alkaline Phosphatase: 51 U/L (ref 38–126)
Anion gap: 11 (ref 5–15)
BUN: 5 mg/dL — ABNORMAL LOW (ref 6–20)
CO2: 23 mmol/L (ref 22–32)
Calcium: 9.7 mg/dL (ref 8.9–10.3)
Chloride: 101 mmol/L (ref 98–111)
Creatinine, Ser: 0.96 mg/dL (ref 0.61–1.24)
GFR calc Af Amer: >60 mL/min (ref >60)
GFR calc non Af Amer: >60 mL/min (ref >60)
Glucose, Bld: 131 mg/dL — ABNORMAL HIGH (ref 70–99)
Potassium: 4 mmol/L (ref 3.5–5.1)
Sodium: 135 mmol/L (ref 135–145)
Total Bilirubin: 1.8 mg/dL — ABNORMAL HIGH (ref 0.3–1.2)
Total Protein: 8.4 g/dL — ABNORMAL HIGH (ref 6.5–8.1)

## 2020-01-29 LAB — POC SARS CORONAVIRUS 2 AG -  ED: SARS Coronavirus 2 Ag: NEGATIVE

## 2020-01-29 LAB — LIPASE, BLOOD: Lipase: 22 U/L (ref 11–51)

## 2020-01-29 MED ORDER — SODIUM CHLORIDE 0.9 % IV BOLUS
1000.0000 mL | Freq: Once | INTRAVENOUS | Status: AC
  Administered 2020-01-29: 1000 mL via INTRAVENOUS

## 2020-01-29 MED ORDER — SODIUM CHLORIDE 0.9% FLUSH: 3.0000 mL | Freq: Once | INTRAVENOUS | Status: DC

## 2020-01-29 MED ORDER — ONDANSETRON HCL 4 MG/2ML IJ SOLN
4.0000 mg | Freq: Once | INTRAMUSCULAR | Status: AC | PRN
  Administered 2020-01-29: 4 mg via INTRAVENOUS
  Filled 2020-01-29: qty 2

## 2020-01-29 NOTE — ED Triage Notes (Signed)
Pt states that he began having a HA yesterday.  Pt has been having nausea and vomiting today.  Pt has vomited x6 today including one time in triage, pt vomited 500cc of clear green emesis.

## 2020-01-29 NOTE — ED Notes (Signed)
Pt had boyfriend bring him food to the waiting area, pt tolerating well

## 2020-01-30 ENCOUNTER — Other Ambulatory Visit: Payer: Self-pay

## 2020-01-30 ENCOUNTER — Emergency Department (HOSPITAL_COMMUNITY): Payer: Self-pay

## 2020-01-30 ENCOUNTER — Encounter (HOSPITAL_COMMUNITY): Payer: Self-pay | Admitting: Radiology

## 2020-01-30 DIAGNOSIS — Z9119 Patient's noncompliance with other medical treatment and regimen: Secondary | ICD-10-CM

## 2020-01-30 DIAGNOSIS — Z87891 Personal history of nicotine dependence: Secondary | ICD-10-CM

## 2020-01-30 DIAGNOSIS — Z21 Asymptomatic human immunodeficiency virus [HIV] infection status: Secondary | ICD-10-CM

## 2020-01-30 DIAGNOSIS — A872 Lymphocytic choriomeningitis: Secondary | ICD-10-CM | POA: Diagnosis present

## 2020-01-30 DIAGNOSIS — M549 Dorsalgia, unspecified: Secondary | ICD-10-CM

## 2020-01-30 DIAGNOSIS — R112 Nausea with vomiting, unspecified: Secondary | ICD-10-CM

## 2020-01-30 DIAGNOSIS — R011 Cardiac murmur, unspecified: Secondary | ICD-10-CM

## 2020-01-30 DIAGNOSIS — B20 Human immunodeficiency virus [HIV] disease: Secondary | ICD-10-CM | POA: Insufficient documentation

## 2020-01-30 LAB — CSF CELL COUNT WITH DIFFERENTIAL
Eosinophils, CSF: 0 % (ref 0–1)
Eosinophils, CSF: 0 % (ref 0–1)
Lymphs, CSF: 71 % (ref 40–80)
Lymphs, CSF: 72 % (ref 40–80)
Monocyte-Macrophage-Spinal Fluid: 13 % — ABNORMAL LOW (ref 15–45)
Monocyte-Macrophage-Spinal Fluid: 4 % — ABNORMAL LOW (ref 15–45)
RBC Count, CSF: 3 /mm3 — ABNORMAL HIGH
RBC Count, CSF: 3 /mm3 — ABNORMAL HIGH
Segmented Neutrophils-CSF: 16 % — ABNORMAL HIGH (ref 0–6)
Segmented Neutrophils-CSF: 24 % — ABNORMAL HIGH (ref 0–6)
Tube #: 1
Tube #: 4
WBC, CSF: 187 /mm3 (ref 0–5)
WBC, CSF: 380 /mm3 (ref 0–5)

## 2020-01-30 LAB — CRYPTOCOCCAL ANTIGEN, CSF: Crypto Ag: NEGATIVE

## 2020-01-30 LAB — RESPIRATORY PANEL BY PCR

## 2020-01-30 LAB — T-HELPER CELLS (CD4) COUNT (NOT AT ARMC)
CD4 % Helper T Cell: 22 % — ABNORMAL LOW (ref 33–65)
CD4 T Cell Abs: 203 /uL — ABNORMAL LOW (ref 400–1790)

## 2020-01-30 LAB — PATHOLOGIST SMEAR REVIEW: Path Review: INCREASED

## 2020-01-30 LAB — SARS CORONAVIRUS 2 (TAT 6-24 HRS): SARS Coronavirus 2: NEGATIVE

## 2020-01-30 LAB — RPR: RPR Ser Ql: NONREACTIVE

## 2020-01-30 LAB — PROTIME-INR
INR: 1.1 (ref 0.8–1.2)
Prothrombin Time: 14 seconds (ref 11.4–15.2)

## 2020-01-30 LAB — HIV-1 RNA QUANT-NO REFLEX-BLD
HIV 1 RNA Quant: 5790 copies/mL
LOG10 HIV-1 RNA: 3.763 log10copy/mL

## 2020-01-30 LAB — APTT: aPTT: 25 seconds (ref 24–36)

## 2020-01-30 LAB — PROTEIN, CSF: Total  Protein, CSF: 51 mg/dL — ABNORMAL HIGH (ref 15–45)

## 2020-01-30 LAB — GLUCOSE, CSF: Glucose, CSF: 81 mg/dL — ABNORMAL HIGH (ref 40–70)

## 2020-01-30 MED ORDER — KETOROLAC TROMETHAMINE 15 MG/ML IJ SOLN
15.0000 mg | Freq: Once | INTRAMUSCULAR | Status: AC
  Administered 2020-01-30: 15 mg via INTRAVENOUS
  Filled 2020-01-30: qty 1

## 2020-01-30 MED ORDER — HYDROMORPHONE HCL 1 MG/ML IJ SOLN
0.5000 mg | Freq: Once | INTRAMUSCULAR | Status: AC
  Administered 2020-01-30: 0.5 mg via INTRAVENOUS
  Filled 2020-01-30: qty 1

## 2020-01-30 MED ORDER — LACTATED RINGERS IV SOLN
INTRAVENOUS | Status: DC
  Administered 2020-01-30: 1000 mL via INTRAVENOUS

## 2020-01-30 MED ORDER — ONDANSETRON HCL 4 MG PO TABS: 4.0000 mg | ORAL_TABLET | Freq: Four times a day (QID) | ORAL | Status: DC | PRN

## 2020-01-30 MED ORDER — DEXAMETHASONE SODIUM PHOSPHATE 10 MG/ML IJ SOLN
10.0000 mg | Freq: Once | INTRAMUSCULAR | Status: AC
  Administered 2020-01-30: 01:00:00 10 mg via INTRAVENOUS
  Filled 2020-01-30: qty 1

## 2020-01-30 MED ORDER — ENOXAPARIN SODIUM 40 MG/0.4ML ~~LOC~~ SOLN
40.0000 mg | SUBCUTANEOUS | Status: DC
  Administered 2020-01-30: 40 mg via SUBCUTANEOUS
  Filled 2020-01-30: qty 0.4

## 2020-01-30 MED ORDER — SODIUM CHLORIDE 0.9 % IV SOLN
2.0000 g | Freq: Once | INTRAVENOUS | Status: AC
  Administered 2020-01-30: 2 g via INTRAVENOUS
  Filled 2020-01-30: qty 20

## 2020-01-30 MED ORDER — VANCOMYCIN HCL IN DEXTROSE 1-5 GM/200ML-% IV SOLN
1000.0000 mg | Freq: Once | INTRAVENOUS | Status: AC
  Administered 2020-01-30: 1000 mg via INTRAVENOUS
  Filled 2020-01-30: qty 200

## 2020-01-30 MED ORDER — SODIUM CHLORIDE 0.9 % IV SOLN: Freq: Once | INTRAVENOUS | Status: AC

## 2020-01-30 MED ORDER — DIPHENHYDRAMINE HCL 50 MG/ML IJ SOLN
12.5000 mg | Freq: Once | INTRAMUSCULAR | Status: AC
  Administered 2020-01-30: 01:00:00 12.5 mg via INTRAVENOUS
  Filled 2020-01-30: qty 1

## 2020-01-30 MED ORDER — SODIUM CHLORIDE 0.9 % IV BOLUS
1000.0000 mL | Freq: Once | INTRAVENOUS | Status: AC
  Administered 2020-01-30: 1000 mL via INTRAVENOUS

## 2020-01-30 MED ORDER — KETOROLAC TROMETHAMINE 30 MG/ML IJ SOLN
15.0000 mg | Freq: Once | INTRAMUSCULAR | Status: AC
  Administered 2020-01-30: 19:00:00 15 mg via INTRAVENOUS
  Filled 2020-01-30: qty 1

## 2020-01-30 MED ORDER — METOCLOPRAMIDE HCL 5 MG/ML IJ SOLN
10.0000 mg | Freq: Once | INTRAMUSCULAR | Status: AC
  Administered 2020-01-30: 01:00:00 10 mg via INTRAVENOUS
  Filled 2020-01-30: qty 2

## 2020-01-30 MED ORDER — ACETAMINOPHEN 325 MG PO TABS
650.0000 mg | ORAL_TABLET | Freq: Four times a day (QID) | ORAL | Status: DC | PRN
  Administered 2020-01-30 – 2020-01-31 (×3): 650 mg via ORAL
  Filled 2020-01-30 (×3): qty 2

## 2020-01-30 MED ORDER — BICTEGRAVIR-EMTRICITAB-TENOFOV 50-200-25 MG PO TABS
1.0000 | ORAL_TABLET | Freq: Every day | ORAL | Status: DC
  Administered 2020-01-30 – 2020-01-31 (×2): 1 via ORAL
  Filled 2020-01-30 (×2): qty 1

## 2020-01-30 MED ORDER — ONDANSETRON HCL 4 MG/2ML IJ SOLN: 4.0000 mg | Freq: Four times a day (QID) | INTRAMUSCULAR | Status: DC | PRN

## 2020-01-30 MED ORDER — DEXTROSE 5 % IV SOLN
10.0000 mg/kg | Freq: Three times a day (TID) | INTRAVENOUS | Status: DC
  Administered 2020-01-30: 14:00:00 800 mg via INTRAVENOUS
  Filled 2020-01-30 (×3): qty 16

## 2020-01-30 MED ORDER — ACETAMINOPHEN 500 MG PO TABS
1000.0000 mg | ORAL_TABLET | Freq: Once | ORAL | Status: AC
  Administered 2020-01-30: 1000 mg via ORAL
  Filled 2020-01-30: qty 2

## 2020-01-30 MED ORDER — ACETAMINOPHEN 650 MG RE SUPP: 650.0000 mg | Freq: Four times a day (QID) | RECTAL | Status: DC | PRN

## 2020-01-30 MED ORDER — SODIUM CHLORIDE 0.9 % IV SOLN
500.0000 mg | Freq: Once | INTRAVENOUS | Status: AC
  Administered 2020-01-30: 05:00:00 500 mg via INTRAVENOUS
  Filled 2020-01-30: qty 500

## 2020-01-30 MED ORDER — DEXTROSE 5 % IV SOLN
800.0000 mg | Freq: Once | INTRAVENOUS | Status: AC
  Administered 2020-01-30: 800 mg via INTRAVENOUS
  Filled 2020-01-30: qty 16

## 2020-01-30 NOTE — Progress Notes (Signed)
New Admission Note:   Arrival Method: Arrived from [facility] ED via stretcher. Mental Orientation: Alert and oriented x4 Telemetry: N/A Assessment: Completed Skin: See doc flowsheet. IV: Rt AC,Rt FA Pain: 7/10 Tubes: N/A Safety Measures: Safety Fall Prevention Plan has been discussed.  Admission: Completed Orientation: Patient has been orientated to the room, unit and staff.  Family: None at bedside.  Orders have been reviewed and implemented. Will continue to monitor the patient. Call light has been placed within reach and bed alarm has been activated.   Ellyson Rarick [employer], RN-BC Phone number: [phone]

## 2020-01-30 NOTE — ED Notes (Signed)
Patient is resting on stretcher no complaints at present. Family at bedside. No  Complaints at present.

## 2020-01-30 NOTE — Plan of Care (Signed)
  Problem: Education: Goal: Knowledge of General Education information will improve Description Including pain rating scale, medication(s)/side effects and non-pharmacologic comfort measures Outcome: Progressing   

## 2020-01-30 NOTE — ED Provider Notes (Signed)
[facility] EMERGENCY DEPARTMENT Provider Note   CSN: [id number] Arrival date & time: 01/29/20  1618     History Chief Complaint  Patient presents with  . Headache  . Emesis    Paul Palmer is a 34 y.o. male.  Patient with history of untreated HIV presents with headache x 2 days described as intense, frontal, causing photophobia. No fever. He has had nausea and vomiting since earlier today (01/29/20). No history of headaches. No visual changes. He states he has not been treated for HIV in multiple years secondary to financial constraints. He does not know his viral load status. Per chart review, last CD4 count 590 (2017). No diarrhea, congestion, cough, SOB or chest pain. No rash.  The history is provided by the patient. No language interpreter was used.  Headache Associated symptoms: photophobia and vomiting   Associated symptoms: no abdominal pain, no cough, no fever and no neck stiffness   Emesis Associated symptoms: headaches   Associated symptoms: no abdominal pain, no chills, no cough and no fever        Past Medical History:  Diagnosis Date  . HIV (human immunodeficiency virus infection) [facility])     Patient Active Problem List   Diagnosis Date Noted  . Chlamydia 01/22/2016  . History of gonorrhea 01/22/2016  . Former smoker 08/23/2014  . Encounter for long-term (current) use of medications 08/22/2014  . Tinea versicolor 08/22/2014  . 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 Healthcare maintenance 09/05/2013  . Seasonal allergies 09/06/2012  . HIV disease ([place]) 07/23/2011    Past Surgical History:  Procedure Laterality Date  . none         Family History  Problem Relation Age of Onset  . Hypertension Mother        and grandparents  . Diabetes Other        grandparents (deceased)    Social History   Tobacco Use  . Smoking status: Former Smoker    Packs/day: 0.25    Years: 9.00    Pack years: 2.25    Types: Cigarettes   Start date: 08/22/2005  . Smokeless tobacco: Never Used  . Tobacco comment: congratulated!!!  Substance Use Topics  . Alcohol use: Yes    Alcohol/week: 0.0 standard drinks    Comment: occ  . Drug use: Yes    Frequency: 7.0 times per week    Types: Marijuana    Home Medications Prior to Admission medications   Medication Sig Start Date End Date Taking? Authorizing Provider  ibuprofen (ADVIL) 800 MG tablet Take 800 mg by mouth daily as needed for fever.    Yes [provider]  elvitegravir-cobicistat-emtricitabine-tenofovir (GENVOYA) 150-150-200-10 MG TABS tablet Take 1 tablet by mouth daily with breakfast. Patient not taking: Reported on 08/10/2018 02/05/16   Michel Bickers, MD    Allergies    Patient has no known allergies.  Review of Systems   Review of Systems  Constitutional: Negative for chills and fever.  HENT: Negative.   Eyes: Positive for photophobia.  Respiratory: Negative.  Negative for cough and shortness of breath.   Cardiovascular: Negative.  Negative for chest pain.  Gastrointestinal: Positive for vomiting. Negative for abdominal pain.  Musculoskeletal: Negative.  Negative for neck stiffness.  Skin: Negative.  Negative for rash.  Neurological: Positive for headaches.    Physical Exam Updated Vital Signs BP 120/84 (BP Location: Left Arm)   Pulse 81   Temp 99.1 F (37.3 C) (Oral)   Resp 16  SpO2 100%   Physical Exam Vitals and nursing note reviewed.  Constitutional:      Appearance: He is well-developed.     Comments: Uncomfortable appearing.  HENT:     Head: Normocephalic.  Eyes:     General: No visual field deficit.    Extraocular Movements: Extraocular movements intact.     Pupils: Pupils are equal, round, and reactive to light.  Cardiovascular:     Rate and Rhythm: Normal rate and regular rhythm.     Heart sounds: No murmur.  Pulmonary:     Effort: Pulmonary effort is normal.     Breath sounds: Normal breath sounds. No wheezing,  rhonchi or rales.  Abdominal:     Palpations: Abdomen is soft.     Tenderness: There is no abdominal tenderness. There is no guarding or rebound.  Musculoskeletal:        General: Normal range of motion.     Cervical back: Normal range of motion and neck supple. No rigidity.  Lymphadenopathy:     Cervical: No cervical adenopathy.  Skin:    General: Skin is warm and dry.     Findings: No rash.  Neurological:     Mental Status: He is alert and oriented to person, place, and time.     GCS: GCS eye subscore is 4. GCS verbal subscore is 5. GCS motor subscore is 6.     Cranial Nerves: No cranial nerve deficit, dysarthria or facial asymmetry.     Sensory: No sensory deficit.     Motor: No weakness.     Coordination: Coordination normal.     ED Results / Procedures / Treatments   Labs (all labs ordered are listed, but only abnormal results are displayed) Labs Reviewed  COMPREHENSIVE METABOLIC PANEL - Abnormal; Notable for the following components:      Result Value   Glucose, Bld 131 (*)    BUN 5 (*)    Total Protein 8.4 (*)    Total Bilirubin 1.8 (*)    All other components within normal limits  CBC - Abnormal; Notable for the following components:   Hemoglobin 17.3 (*)    MCHC 36.6 (*)    All other components within normal limits  URINALYSIS, ROUTINE W REFLEX MICROSCOPIC - Abnormal; Notable for the following components:   Color, Urine AMBER (*)    Bilirubin Urine SMALL (*)    Ketones, ur 80 (*)    Protein, ur 100 (*)    All other components within normal limits  LIPASE, BLOOD  POC SARS CORONAVIRUS 2 AG -  ED   Results for orders placed or performed during the hospital encounter of 01/29/20  CSF culture   Specimen: CSF; Cerebrospinal Fluid  Result Value Ref Range   Specimen Description CSF    Special Requests TUBE 2    Gram Stain      WBC PRESENT,BOTH PMN AND MONONUCLEAR NO ORGANISMS SEEN CYTOSPIN SMEAR Performed at [facility], [address]  [place], [place] [address]    Culture PENDING    Report Status PENDING   Lipase, blood  Result Value Ref Range   Lipase 22 11 - 51 U/L  Comprehensive metabolic panel  Result Value Ref Range   Sodium 135 135 - 145 mmol/L   Potassium 4.0 3.5 - 5.1 mmol/L   Chloride 101 98 - 111 mmol/L   CO2 23 22 - 32 mmol/L   Glucose, Bld 131 (H) 70 - 99 mg/dL   BUN 5 (  L) 6 - 20 mg/dL   Creatinine, Ser 0.96 0.61 - 1.24 mg/dL   Calcium 9.7 8.9 - 10.3 mg/dL   Total Protein 8.4 (H) 6.5 - 8.1 g/dL   Albumin 4.6 3.5 - 5.0 g/dL   AST 18 15 - 41 U/L   ALT 14 0 - 44 U/L   Alkaline Phosphatase 51 38 - 126 U/L   Total Bilirubin 1.8 (H) 0.3 - 1.2 mg/dL   GFR calc non Af Amer >60 >60 mL/min   GFR calc Af Amer >60 >60 mL/min   Anion gap 11 5 - 15  CBC  Result Value Ref Range   WBC 7.7 4.0 - 10.5 K/uL   RBC 5.29 4.22 - 5.81 MIL/uL   Hemoglobin 17.3 (H) 13.0 - 17.0 g/dL   HCT 47.3 39.0 - 52.0 %   MCV 89.4 80.0 - 100.0 fL   MCH 32.7 26.0 - 34.0 pg   MCHC 36.6 (H) 30.0 - 36.0 g/dL   RDW 12.3 11.5 - 15.5 %   Platelets 273 150 - 400 K/uL   nRBC 0.0 0.0 - 0.2 %  Urinalysis, Routine w reflex microscopic  Result Value Ref Range   Color, Urine AMBER (A) YELLOW   APPearance CLEAR CLEAR   Specific Gravity, Urine 1.029 1.005 - 1.030   pH 7.0 5.0 - 8.0   Glucose, UA NEGATIVE NEGATIVE mg/dL   Hgb urine dipstick NEGATIVE NEGATIVE   Bilirubin Urine SMALL (A) NEGATIVE   Ketones, ur 80 (A) NEGATIVE mg/dL   Protein, ur 100 (A) NEGATIVE mg/dL   Nitrite NEGATIVE NEGATIVE   Leukocytes,Ua NEGATIVE NEGATIVE   RBC / HPF 6-10 0 - 5 RBC/hpf   WBC, UA 0-5 0 - 5 WBC/hpf   Bacteria, UA NONE SEEN NONE SEEN   Mucus PRESENT   CSF cell count with differential collection tube #: 1  Result Value Ref Range   Tube # 1    Color, CSF COLORLESS COLORLESS   Appearance, CSF CLEAR CLEAR   Supernatant NOT INDICATED    RBC Count, CSF 3 (H) 0 /cu mm   WBC, CSF 380 (HH) 0 - 5 /cu mm   Segmented Neutrophils-CSF 24 (H) 0 - 6 %   Lymphs,  CSF 72 40 - 80 %   Monocyte-Macrophage-Spinal Fluid 4 (L) 15 - 45 %   Eosinophils, CSF 0 0 - 1 %  CSF cell count with differential collection tube #: 4  Result Value Ref Range   Tube # 4    Color, CSF COLORLESS COLORLESS   Appearance, CSF HAZY (A) CLEAR   Supernatant NOT INDICATED    RBC Count, CSF 3 (H) 0 /cu mm   WBC, CSF 187 (HH) 0 - 5 /cu mm   Segmented Neutrophils-CSF 16 (H) 0 - 6 %   Lymphs, CSF 71 40 - 80 %   Monocyte-Macrophage-Spinal Fluid 13 (L) 15 - 45 %   Eosinophils, CSF 0 0 - 1 %  Glucose, CSF  Result Value Ref Range   Glucose, CSF 81 (H) 40 - 70 mg/dL  Protein, CSF  Result Value Ref Range   Total  Protein, CSF 51 (H) 15 - 45 mg/dL  Cryptococcal antigen, CSF  Result Value Ref Range   Crypto Ag NEGATIVE NEGATIVE   Cryptococcal Ag Titer NOT INDICATED NOT INDICATED  Protime-INR  Result Value Ref Range   Prothrombin Time 14.0 11.4 - 15.2 seconds   INR 1.1 0.8 - 1.2  APTT  Result Value Ref Range  aPTT 25 24 - 36 seconds  POC SARS Coronavirus 2 Ag-ED - Nasal Swab (BD Veritor Kit)  Result Value Ref Range   SARS Coronavirus 2 Ag NEGATIVE NEGATIVE    EKG None  Radiology No results found. CT Head Wo Contrast  Result Date: 01/30/2020 CLINICAL DATA:  Headache, nausea/vomiting EXAM: CT HEAD WITHOUT CONTRAST TECHNIQUE: Contiguous axial images were obtained from the base of the skull through the vertex without intravenous contrast. COMPARISON:  11/19/2011 FINDINGS: Brain: No evidence of acute infarction, hemorrhage, hydrocephalus, extra-axial collection or mass lesion/mass effect. Vascular: No hyperdense vessel or unexpected calcification. Skull: Normal. Negative for fracture or focal lesion. Sinuses/Orbits: Partial opacification of the left frontal sinus. Visualized paranasal sinuses and mastoid air cells are otherwise clear. Other: None. IMPRESSION: Normal head CT. Electronically Signed   By: Julian Hy M.D.   On: 01/30/2020 02:15   DG Chest Portable 1  View  Result Date: 01/30/2020 CLINICAL DATA:  Headaches and nausea and vomiting EXAM: PORTABLE CHEST 1 VIEW COMPARISON:  09/03/2014 FINDINGS: Cardiac shadow is within normal limits. The lungs are well aerated bilaterally. Mild increased interstitial changes are noted particularly in the right lung. Given the patient's immunocompromised state, this may represent atypical pneumonia. No bony abnormality is noted. IMPRESSION: Patchy interstitial changes particularly in the right lung which may represent some early atypical pneumonia. Electronically Signed   By: Inez Catalina M.D.   On: 01/30/2020 00:48    Procedures Procedures (including critical care time) CRITICAL CARE Performed by: Dewaine Oats   Total critical care time: 50 minutes  Critical care time was exclusive of separately billable procedures and treating other patients.  Critical care was necessary to treat or prevent imminent or life-threatening deterioration.  Critical care was time spent personally by me on the following activities: development of treatment plan with patient and/or surrogate as well as nursing, discussions with consultants, evaluation of patient's response to treatment, examination of patient, obtaining history from patient or surrogate, ordering and performing treatments and interventions, ordering and review of laboratory studies, ordering and review of radiographic studies, pulse oximetry and re-evaluation of patient's condition.  Medications Ordered in ED Medications  sodium chloride flush (NS) 0.9 % injection 3 mL (3 mLs Intravenous Not Given 01/29/20 1740)  metoCLOPramide (REGLAN) injection 10 mg (has no administration in time range)  diphenhydrAMINE (BENADRYL) injection 12.5 mg (has no administration in time range)  HYDROmorphone (DILAUDID) injection 0.5 mg (has no administration in time range)  dexamethasone (DECADRON) injection 10 mg (has no administration in time range)  acyclovir (ZOVIRAX) 10 mg/kg in  dextrose 5 % 250 mL IVPB (has no administration in time range)  vancomycin (VANCOCIN) IVPB 1000 mg/200 mL premix (has no administration in time range)  cefTRIAXone (ROCEPHIN) 2 g in sodium chloride 0.9 % 100 mL IVPB (has no administration in time range)  0.9 %  sodium chloride infusion (has no administration in time range)  ondansetron (ZOFRAN) injection 4 mg (4 mg Intravenous Given 01/29/20 1758)  sodium chloride 0.9 % bolus 1,000 mL (0 mLs Intravenous Stopped 01/29/20 1859)    ED Course  I have reviewed the triage vital signs and the nursing notes.  Pertinent labs & imaging results that were available during my care of the patient were reviewed by me and considered in my medical decision making (see chart for details).    MDM Rules/Calculators/A&P                      Patient  to ED with severe frontal headache x 2 days, nausea and vomiting x 1 day. No known fever at home. Low grade (99.1 on arrival).   No neuro deficits on exam. No visual field loss. Alert, oriented. Medications ordered for symptoms. On re-evaluation, his symptoms are improving.   Patient's presentation concerning for meningitis, atypical infection, given untreated HIV. CT head negative for bleed. Labs initially reassuring. No history of headaches. LP planned.   CXR abnormal, concerning for atypical PNA. Patient has no cough or fever. No SOB.   Antibiotics started for coverage atypical opportunistic infections, including Rocephin, Acyclovir, Vancomycin. Decadron provided as well.   LP performed. See Dr. Exie Parody procedure note. Results concerning for meningitis with over 300 WBC's, tube 1, and 167 WBC's tube 4. He will need to be admitted for further treatment. Admission should also provide coordination of care with ID to initiate assessment of HIV and to start treatment.  Final Clinical Impression(s) / ED Diagnoses Final diagnoses:  None   1. Meningitis 2. HIV    Rx / DC Orders ED Discharge Orders     None       Charlann Lange, PA-C 01/30/20 0825    Ward, Delice Bison, DO 01/31/20 1616

## 2020-01-30 NOTE — Consult Note (Signed)
[facility]    Date of Admission:  01/29/2020           Day 1 acyclovir       Reason for Consult: Acute lymphocytic meningitis and untreated HIV infection    Referring Provider: Dr. Chana Palmer  Assessment: Paul Palmer has acute lymphocytic meningitis, most likely due to common viral infection.  Herpes meningitis is relatively unlikely and I will stop IV acyclovir now.  His CD4 count is down to 203 since stopping Genvoya 4 years ago but he is not at risk for HIV related opportunistic infection.  I will start him on Biktarvy (which he can take with or without meals).  We will work on getting him a month supply before discharge and get him back into our clinic ASAP.  Plan: 1. DC acyclovir 2. Start Biktarvy 3. Await results of HIV viral load 4. Check RPR, GC and Chlamydia screens  Principal Problem:   Acute lymphocytic meningitis Active Problems:   HIV disease (HCC)   Scheduled Meds: . enoxaparin (LOVENOX) injection  40 mg Subcutaneous Q24H  . sodium chloride flush  3 mL Intravenous Once   Continuous Infusions: . acyclovir Stopped (01/30/20 1523)  . lactated ringers 1,000 mL (01/30/20 1358)   PRN Meds:.acetaminophen **OR** acetaminophen, ondansetron **OR** ondansetron (ZOFRAN) IV  HPI: Paul Palmer is a 34 y.o. male with a history of HIV infection.  He fell out of care and ran out of Genvoya shortly after his last visit with me in 2017.  He has been under the mistaken impression that we would be unable to get him a steady supply of Genvoya.  He was feeling well until 3 days ago when he had sudden onset of severe headache, nausea and vomiting.  He came to the ED last night and underwent lumbar puncture which revealed acute lymphocytic meningitis.  His glucose was normal and his protein was only slightly elevated.  CSF cryptococcal antigen is negative.  No organisms were seen on Gram stain.  He says that he is feeling better today.   Review  of Systems: Review of Systems  Constitutional: Positive for malaise/fatigue. Negative for chills, diaphoresis, fever and weight loss.  HENT: Negative for congestion and sore throat.   Respiratory: Positive for cough. Negative for sputum production and shortness of breath.        His chronic cough is unchanged.  He thinks that it is secondary to his marijuana use.  Cardiovascular: Negative for chest pain.  Gastrointestinal: Positive for nausea and vomiting. Negative for abdominal pain and diarrhea.  Genitourinary: Negative for dysuria.  Musculoskeletal: Positive for back pain.       He has some mild back pain that started after the lumbar puncture.  Skin: Negative for rash.  Neurological: Positive for headaches.    Past Medical History:  Diagnosis Date  . HIV (human immunodeficiency virus infection) (HCC)     Social History   Tobacco Use  . Smoking status: Former Smoker    Packs/day: 0.25    Years: 9.00    Pack years: 2.25    Types: Cigarettes    Start date: 08/22/2005  . Smokeless tobacco: Never Used  . Tobacco comment: congratulated!!!  Substance Use Topics  . Alcohol use: Yes    Alcohol/week: 0.0 standard drinks    Comment: occ  . Drug use: Yes    Frequency: 7.0 times per week    Types: Marijuana    Family History  Problem Relation Age of Onset  . Hypertension Mother        and grandparents  . Diabetes Other        grandparents (deceased)   No Known Allergies  OBJECTIVE: Blood pressure 103/66, pulse 64, temperature 98.4 F (36.9 C), temperature source Oral, resp. rate 15, height 6\' 4"  (1.93 m), weight 80 kg, SpO2 100 %.  Physical Exam Constitutional:      Comments: He is resting quietly on a stretcher in a darkened room.  He is in no distress.  He is talking to his mother on his phone.  HENT:     Mouth/Throat:     Pharynx: No oropharyngeal exudate.  Eyes:     Conjunctiva/sclera: Conjunctivae normal.  Cardiovascular:     Rate and Rhythm: Normal rate and  regular rhythm.     Heart sounds: No murmur.  Pulmonary:     Effort: Pulmonary effort is normal.     Breath sounds: Normal breath sounds.  Abdominal:     Palpations: Abdomen is soft.     Tenderness: There is no abdominal tenderness.  Musculoskeletal:        General: No swelling or tenderness.     Cervical back: Neck supple.  Skin:    Findings: No rash.  Neurological:     General: No focal deficit present.  Psychiatric:        Mood and Affect: Mood normal.     Lab Results Lab Results  Component Value Date   WBC 7.7 01/29/2020   HGB 17.3 (H) 01/29/2020   HCT 47.3 01/29/2020   MCV 89.4 01/29/2020   PLT 273 01/29/2020    Lab Results  Component Value Date   CREATININE 0.96 01/29/2020   BUN 5 (L) 01/29/2020   NA 135 01/29/2020   K 4.0 01/29/2020   CL 101 01/29/2020   CO2 23 01/29/2020    Lab Results  Component Value Date   ALT 14 01/29/2020   AST 18 01/29/2020   ALKPHOS 51 01/29/2020   BILITOT 1.8 (H) 01/29/2020     Microbiology: Recent Results (from the past 240 hour(s))  Culture, blood (routine x 2)     Status: None (Preliminary result)   Collection Time: 01/30/20  1:00 AM   Specimen: BLOOD RIGHT FOREARM  Result Value Ref Range Status   Specimen Description BLOOD RIGHT FOREARM  Final   Special Requests   Final    BOTTLES DRAWN AEROBIC AND ANAEROBIC Blood Culture adequate volume   Culture   Final    NO GROWTH < 24 HOURS Performed at [facility], [place] [address] [place], [place] [address]    Report Status PENDING  Incomplete  Culture, blood (routine x 2)     Status: None (Preliminary result)   Collection Time: 01/30/20  1:31 AM   Specimen: BLOOD  Result Value Ref Range Status   Specimen Description BLOOD RIGHT ANTECUBITAL  Final   Special Requests   Final    BOTTLES DRAWN AEROBIC AND ANAEROBIC Blood Culture adequate volume   Culture   Final    NO GROWTH < 24 HOURS Performed at [facility], [place] [address], [place], [place] [address]     Report Status PENDING  Incomplete  SARS CORONAVIRUS 2 (TAT 6-24 HRS) Nasopharyngeal Nasopharyngeal Swab     Status: None   Collection Time: 01/30/20  2:25 AM   Specimen: Nasopharyngeal Swab  Result Value Ref Range Status   SARS Coronavirus 2 NEGATIVE NEGATIVE Final  Comment: (NOTE) SARS-CoV-2 target nucleic acids are NOT DETECTED. The SARS-CoV-2 RNA is generally detectable in upper and lower respiratory specimens during the acute phase of infection. Negative results do not preclude SARS-CoV-2 infection, do not rule out co-infections with other pathogens, and should not be used as the sole basis for treatment or other patient management decisions. Negative results must be combined with clinical observations, patient history, and epidemiological information. The expected result is Negative. Fact Sheet for Patients: HairSlick.no Fact Sheet for Healthcare Providers: quierodirigir.com This test is not yet approved or cleared by the [place] FDA and  has been authorized for detection and/or diagnosis of SARS-CoV-2 by FDA under an Emergency Use Authorization (EUA). This EUA will remain  in effect (meaning this test can be used) for the duration of the COVID-19 declaration under Section 56 4(b)(1) of the Act, 21 U.S.C. section 360bbb-3(b)(1), unless the authorization is terminated or revoked sooner. Performed at [facility], [address] [place], [place] [address]   CSF culture     Status: None (Preliminary result)   Collection Time: 01/30/20  5:08 AM   Specimen: CSF; Cerebrospinal Fluid  Result Value Ref Range Status   Specimen Description CSF  Final   Special Requests TUBE 2  Final   Gram Stain   Final    WBC PRESENT,BOTH PMN AND MONONUCLEAR NO ORGANISMS SEEN CYTOSPIN SMEAR Performed at [facility], [address] [place], [place] [address]    Culture PENDING  Incomplete   Report Status PENDING   Incomplete  Respiratory Panel by PCR     Status: None   Collection Time: 01/30/20 11:36 AM   Specimen: Nasopharyngeal Swab; Respiratory  Result Value Ref Range Status   Adenovirus NOT DETECTED NOT DETECTED Final   Coronavirus 229E NOT DETECTED NOT DETECTED Final    Comment: (NOTE) The Coronavirus on the Respiratory Panel, DOES NOT test for the novel  Coronavirus (2019 nCoV)    Coronavirus HKU1 NOT DETECTED NOT DETECTED Final   Coronavirus NL63 NOT DETECTED NOT DETECTED Final   Coronavirus OC43 NOT DETECTED NOT DETECTED Final   Metapneumovirus NOT DETECTED NOT DETECTED Final   Rhinovirus / Enterovirus NOT DETECTED NOT DETECTED Final   Influenza A NOT DETECTED NOT DETECTED Final   Influenza B NOT DETECTED NOT DETECTED Final   Parainfluenza Virus 1 NOT DETECTED NOT DETECTED Final   Parainfluenza Virus 2 NOT DETECTED NOT DETECTED Final   Parainfluenza Virus 3 NOT DETECTED NOT DETECTED Final   Parainfluenza Virus 4 NOT DETECTED NOT DETECTED Final   Respiratory Syncytial Virus NOT DETECTED NOT DETECTED Final   Bordetella pertussis NOT DETECTED NOT DETECTED Final   Chlamydophila pneumoniae NOT DETECTED NOT DETECTED Final   Mycoplasma pneumoniae NOT DETECTED NOT DETECTED Final    Comment: Performed at [facility], [address] [place], [place] [address]   HIV 1 RNA Quant (copies/mL)  Date Value  03/24/2016 52 (H)  12/30/2015 2,951 (H)  09/20/2014 <20   CD4 T Cell Abs (/uL)  Date Value  01/30/2020 203 (L)  03/24/2016 590  12/30/2015 540    Cliffton Asters, MD [facility] [phone] pager   [phone] cell 01/30/2020, 4:50 PM

## 2020-01-30 NOTE — ED Provider Notes (Signed)
Medical screening examination/treatment/procedure(s) were conducted as a shared visit with non-physician practitioner(s) and myself.  I personally evaluated the patient during the encounter.      Patient is a 34 year old male with HIV currently noncompliant with ART who presents to the emergency department with headache, nausea and vomiting.  No fever or meningismus.  No history of chronic headaches.  Labs here are unremarkable.  Urine shows signs of dehydration.  Chest x-ray concerning for possible right-sided atypical pneumonia.  Covid test pending.  He is not hypoxic here and not having respiratory distress or any URI symptoms.  Lumbar puncture performed.  Patient had received broad-spectrum antibiotics to cover for both atypical pneumonia as well as bacterial causes of meningitis.  CSF shows elevated white blood cells but no organisms grown at this time.  Will admit patient for concerns for meningitis.    .Lumbar Puncture  Date/Time: 01/30/2020 5:11 AM Performed by: Taniya Dasher, Layla Maw, DO Authorized by: Gordana Kewley, Layla Maw, DO   Consent:    Consent obtained:  Verbal   Consent given by:  Patient and parent   Risks discussed:  Bleeding, headache, infection, nerve damage, pain and repeat procedure Pre-procedure details:    Procedure purpose:  Diagnostic   Preparation: Patient was prepped and draped in usual sterile fashion   Procedure details:    Lumbar space:  L3-L4 interspace   Patient position:  Sitting   Needle gauge:  20   Needle type:  Spinal needle - Quincke tip   Needle length (in):  3.5   Ultrasound guidance: no     Number of attempts:  1   Fluid appearance:  Clear   Tubes of fluid:  4   Total volume (ml):  6 Post-procedure:    Puncture site:  Direct pressure applied and adhesive bandage applied   Patient tolerance of procedure:  Tolerated well, no immediate complications      Ralphine Hinks, Layla Maw, DO 01/30/20 [phone]

## 2020-01-30 NOTE — ED Notes (Signed)
French Ana white mother looking for an update

## 2020-01-30 NOTE — H&P (Addendum)
Date: 01/30/2020               Patient Name:  Paul Palmer MRN: [id number]  DOB: 10-06-1986 Age / Sex: 34 y.o., male   PCP: Shelva Majestic, MD         Medical Service: Internal Medicine Teaching Service         Attending Physician: Dr. Gust Rung, DO    First Contact: Ephriam Knuckles, MD, Rylee Pager: RC [phone])  Second Contact: Cleaster Corin DO, Jaimie PagerDuane Lope [phone])       After Hours (After 5p/  First Contact Pager: [phone]  weekends / holidays): Second Contact Pager: [phone]   Chief Complaint: Headache, N/V  History of Present Illness: 34 y.o. yo male w/ PMH significant for HIV, no migraine history.  Presents with headache beginning Sunday evening which evolved into worsening headache with N/V Monday morning, frontal headache with photofobia. Some transient blurry vision that resolved, no noticeable subjective neuro deficits weakness, imbalance, facial asymmetry.  No dental pain.  He reports he has not been to his ID doctor in around 5 years other than trying to re-establish care before the covid pandemic started but he never followed through.   Before this severe headache he reports no real symptoms no night sweats, fevers, chills leading up to his current presentation.  Not able to get vaccinated for covid.  His mother is upset after being here all night, she wants all his results to come back immediately so she can go home.    Meds:  Current Meds  Medication Sig   ibuprofen (ADVIL) 800 MG tablet Take 800 mg by mouth daily as needed for fever.      Allergies: Allergies as of 01/29/2020   (No Known Allergies)   Past Medical History:  Diagnosis Date   HIV (human immunodeficiency virus infection) (HCC)     Family History:  Family History  Problem Relation Age of Onset   Hypertension Mother        and grandparents   Diabetes Other        grandparents (deceased)     Social History:  Social History   Tobacco Use   Smoking status: Former  Smoker    Packs/day: 0.25    Years: 9.00    Pack years: 2.25    Types: Cigarettes    Start date: 08/22/2005   Smokeless tobacco: Never Used   Tobacco comment: congratulated!!!  Substance Use Topics   Alcohol use: Yes    Alcohol/week: 0.0 standard drinks    Comment: occ   Drug use: Yes    Frequency: 7.0 times per week    Types: Marijuana     Review of Systems: A complete ROS was negative except as per HPI.   Physical Exam: Blood pressure 111/75, pulse (!) 58, temperature 98.4 F (36.9 C), temperature source Oral, resp. rate 15, weight 80 kg, SpO2 100 %. Physical Exam Constitutional:      Appearance: He is not diaphoretic.  HENT:     Head: Normocephalic and atraumatic.  Eyes:     General: No visual field deficit or scleral icterus.       Right eye: No discharge.        Left eye: No discharge.  Neck:     Meningeal: Brudzinski's sign and Kernig's sign absent.  Cardiovascular:     Rate and Rhythm: Normal rate and regular rhythm.     Heart sounds: Murmur present. Systolic murmur present. No friction rub. No  gallop.   Pulmonary:     Effort: Pulmonary effort is normal. No respiratory distress.     Breath sounds: Normal breath sounds. No wheezing or rales.  Abdominal:     General: Bowel sounds are normal. There is no distension.     Palpations: Abdomen is soft. There is no mass.     Tenderness: There is no abdominal tenderness. There is no guarding.  Neurological:     Mental Status: He is alert and oriented to person, place, and time.     Cranial Nerves: No cranial nerve deficit or dysarthria.     Sensory: No sensory deficit.     Motor: No weakness.     Coordination: Coordination normal.  Psychiatric:        Mood and Affect: Mood normal.        Behavior: Behavior normal.     EKG: personally reviewed my interpretation is none available  CXR: personally reviewed my interpretation is no acute cardiopulmonary process  Assessment & Plan by Problem: Principal Problem:    Acute lymphocytic meningitis Active Problems:   HIV disease (HCC)   HIV, symptomatic (HCC)   Severe Headache/Lymphocytic Pleocytosis of CSF: in a patient not compliant with haart therapy with chronic HIV infection.  Last CD4 count was 590 in 2017.  Today his CD4 count is 203.  A lumbar puncture was performed which showed normal glucose, mildly elevated protein, elevated WBC count lymphocytic predominant. Csf cryptococcal ag neg, no organisms on gram stain. He was started on broad spec abx although no listeria coverage and also started on acyclovir.  CSF findings seem most consistent with a viral meningitis other than the neutrophil predominance.  RPR/VDRL, viral load,  HSV culture in process, CSF culture in process. His headache has improved, he is afebrile and clinically stable.  -will add on cmv and ebv PCR -consulted infectious disease appreciate further recommendations -continue acyclovir for now will defer to ID on antibacterial agents -tylenol and benadryl/reglan PRN  HIV: previously on genvoya, his CD4 count in 2017 was 590 before he stopped following up with ID.    -ID consulted appreciate recommendations  -he will reestablish care   Dispo: Admit patient to Inpatient with expected length of stay greater than 2 midnights.  Signed: Katherine Roan, MD 01/30/2020, 10:48 AM

## 2020-01-30 NOTE — Progress Notes (Signed)
Pharmacy Antibiotic Note  Paul Palmer is a 34 y.o. male admitted on 01/29/2020 with headache.  Pharmacy has been consulted for acyclovir dosing. Pt is afebrile and WBC is WNL. Scr is WNL. He has a history of HIV.   Plan: Acyclovir 10mg /kg IV Q8H F/u renal fxn, C&S, clinical status and ID recs  Height: 6\' 4"  (193 cm) Weight: 176 lb 5.9 oz (80 kg) IBW/kg (Calculated) : 86.8  Temp (24hrs), Avg:99 F (37.2 C), Min:98.4 F (36.9 C), Max:99.5 F (37.5 C)  Recent Labs  Lab 01/29/20 1733  WBC 7.7  CREATININE 0.96    Estimated Creatinine Clearance: 125 mL/min (by C-G formula based on SCr of 0.96 mg/dL).    No Known Allergies  Antimicrobials this admission: Acyclovir 3/30>> Azithro x 1 3/30 CTX x 1 3/30 Vanc x 1 3/30  Dose adjustments this admission: N/A  Microbiology results: Pending  Thank you for allowing pharmacy to be a part of this patient's care.  Paul Palmer, 4/30 01/30/2020 1:04 PM

## 2020-01-31 DIAGNOSIS — R519 Headache, unspecified: Secondary | ICD-10-CM

## 2020-01-31 DIAGNOSIS — Z9114 Patient's other noncompliance with medication regimen: Secondary | ICD-10-CM

## 2020-01-31 DIAGNOSIS — B360 Pityriasis versicolor: Secondary | ICD-10-CM

## 2020-01-31 DIAGNOSIS — R21 Rash and other nonspecific skin eruption: Secondary | ICD-10-CM

## 2020-01-31 LAB — COMPREHENSIVE METABOLIC PANEL
ALT: 14 U/L (ref 0–44)
AST: 11 U/L — ABNORMAL LOW (ref 15–41)
Albumin: 3.4 g/dL — ABNORMAL LOW (ref 3.5–5.0)
Alkaline Phosphatase: 43 U/L (ref 38–126)
Anion gap: 8 (ref 5–15)
BUN: 7 mg/dL (ref 6–20)
CO2: 22 mmol/L (ref 22–32)
Calcium: 8.7 mg/dL — ABNORMAL LOW (ref 8.9–10.3)
Chloride: 103 mmol/L (ref 98–111)
Creatinine, Ser: 0.88 mg/dL (ref 0.61–1.24)
GFR calc Af Amer: >60 mL/min (ref >60)
GFR calc non Af Amer: >60 mL/min (ref >60)
Glucose, Bld: 89 mg/dL (ref 70–99)
Potassium: 4 mmol/L (ref 3.5–5.1)
Sodium: 133 mmol/L — ABNORMAL LOW (ref 135–145)
Total Bilirubin: 1.5 mg/dL — ABNORMAL HIGH (ref 0.3–1.2)
Total Protein: 6.7 g/dL (ref 6.5–8.1)

## 2020-01-31 LAB — CBC
HCT: 41.3 % (ref 39.0–52.0)
Hemoglobin: 15.1 g/dL (ref 13.0–17.0)
MCH: 33.5 pg (ref 26.0–34.0)
MCHC: 36.6 g/dL — ABNORMAL HIGH (ref 30.0–36.0)
MCV: 91.6 fL (ref 80.0–100.0)
Platelets: 234 10*3/uL (ref 150–400)
RBC: 4.51 MIL/uL (ref 4.22–5.81)
RDW: 12.3 % (ref 11.5–15.5)
WBC: 4.9 10*3/uL (ref 4.0–10.5)
nRBC: 0 % (ref 0.0–0.2)

## 2020-01-31 LAB — HSV DNA BY PCR (REFERENCE LAB)
HSV 1 DNA: NEGATIVE
HSV 2 DNA: NEGATIVE

## 2020-01-31 LAB — HEMOGLOBIN A1C
Hgb A1c MFr Bld: 4.6 % — ABNORMAL LOW (ref 4.8–5.6)
Mean Plasma Glucose: 85.32 mg/dL

## 2020-01-31 LAB — VDRL, CSF: VDRL Quant, CSF: NONREACTIVE

## 2020-01-31 LAB — RPR: RPR Ser Ql: NONREACTIVE

## 2020-01-31 MED ORDER — KETOROLAC TROMETHAMINE 60 MG/2ML IM SOLN
60.0000 mg | Freq: Once | INTRAMUSCULAR | Status: AC
  Administered 2020-01-31: 60 mg via INTRAMUSCULAR
  Filled 2020-01-31: qty 2

## 2020-01-31 MED ORDER — BIKTARVY 50-200-25 MG PO TABS: 1.0000 | ORAL_TABLET | Freq: Every day | ORAL | 11 refills | Status: DC

## 2020-01-31 MED ORDER — DIPHENHYDRAMINE HCL 50 MG/ML IJ SOLN
25.0000 mg | Freq: Once | INTRAMUSCULAR | Status: AC
  Administered 2020-01-31: 25 mg via INTRAVENOUS
  Filled 2020-01-31: qty 1

## 2020-01-31 MED ORDER — FLUCONAZOLE 150 MG PO TABS: 300.0000 mg | ORAL_TABLET | ORAL | 0 refills | Status: DC

## 2020-01-31 MED ORDER — METOCLOPRAMIDE HCL 5 MG/ML IJ SOLN
10.0000 mg | Freq: Once | INTRAMUSCULAR | Status: AC
  Administered 2020-01-31: 10 mg via INTRAVENOUS
  Filled 2020-01-31: qty 2

## 2020-01-31 MED ORDER — FLUCONAZOLE 100 MG PO TABS
300.0000 mg | ORAL_TABLET | ORAL | Status: DC
  Administered 2020-01-31: 300 mg via ORAL
  Filled 2020-01-31: qty 3

## 2020-01-31 MED ORDER — BUTALBITAL-APAP-CAFFEINE 50-325-40 MG PO TABS
1.0000 | ORAL_TABLET | Freq: Once | ORAL | Status: AC
  Administered 2020-01-31: 1 via ORAL
  Filled 2020-01-31: qty 1

## 2020-01-31 MED FILL — BIKTARVY 50-200-25 MG TABS: 50-200-25 | 30 days supply | Qty: 30 | Fill #0

## 2020-01-31 MED FILL — FLUCONAZOLE 150 MG TABS: 150 | 7 days supply | Qty: 4 | Fill #0

## 2020-01-31 NOTE — Plan of Care (Signed)
  Problem: Education: Goal: Knowledge of General Education information will improve Description Including pain rating scale, medication(s)/side effects and non-pharmacologic comfort measures Outcome: Progressing   

## 2020-01-31 NOTE — Progress Notes (Signed)
DISCHARGE NOTE HOME Zeferino Kaine to be discharged Home per MD order. Discussed prescriptions and follow up appointments with the patient. Prescriptions given to patient; medication list explained in detail. Patient verbalized understanding.  Skin clean, dry and intact without evidence of skin break down, no evidence of skin tears noted. IV catheter discontinued intact. Site without signs and symptoms of complications. Dressing and pressure applied. Pt denies pain at the site currently. No complaints noted.  Patient free of lines, drains, and wounds.   An After Visit Summary (AVS) was printed and given to the patient. Patient escorted via wheelchair, and discharged home via private auto.  Lorine Bears, RN

## 2020-01-31 NOTE — Progress Notes (Addendum)
NAMETawan Palmer, MRN:  [id number], DOB:  Dec 13, 1985, LOS: 1 ADMISSION DATE:  01/29/2020   Subjective  No overnight events. VSS  Paul Palmer was examined and evaluated at bedside this am. He mentions continuing to endorse photophobia, blurry vision and frontal headache rated as 6/10. Worsened by sitting up. He provides additional history stating his pain started on Sunday. He mentions having some relief with toradol and diluadid. He also mentions having pruritic rash that started at his back with spread to his arms and trunks. He mentions having relief with treatment from his ID physician, Dr.Campbell.   Significant Hospital Events   3/30>admission for h/a in untreated HIV pt>LP>ID consult 3/31>continued headache/photophobia; complaints of a rash  Objective   Blood pressure 123/74, pulse (!) 52, temperature 98.2 F (36.8 C), resp. rate 18, height 6\' 4"  (1.93 m), weight 78.1 kg, SpO2 100 %.     Intake/Output Summary (Last 24 hours) at 01/31/2020 0516 Last data filed at 01/31/2020 0201 Gross per 24 hour  Intake 1822.5 ml  Output 700 ml  Net 1122.5 ml   Filed Weights   01/30/20 0000 01/30/20 2029  Weight: 80 kg 78.1 kg    Examination: GENERAL: in no acute distress CARDIAC: heart RRR.  PULMONARY: lungs clear NEURO: alert and oriented SKIN: fairly well demarcated hypopigmented rash involving his back.  Significant Diagnostic Tests:  3/30 CXR>patchy intersistial changes in right lung which may represent some early atypical pna  3/30 CT head>normal  Micro Data:  CSF culture>NGTD CSF gram stain>neg Blood cult> NGTD RPR> non-reactive Crypto antigen>neg RVP>neg COVID>neg   Labs    CBC Latest Ref Rng & Units 01/31/2020 01/29/2020 08/10/2018  WBC 4.0 - 10.5 K/uL 4.9 7.7 3.7(L)  Hemoglobin 13.0 - 17.0 g/dL 10/10/2018 17.3(H) 16.2  Hematocrit 39.0 - 52.0 % 41.3 47.3 46.0  Platelets 150 - 400 K/uL 234 273 276   BMP Latest Ref Rng & Units 01/29/2020 08/10/2018 03/24/2016    Glucose 70 - 99 mg/dL 03/26/2016) 99 87  BUN 6 - 20 mg/dL 5(L) 8 10  Creatinine 354(S - 1.24 mg/dL 5.68 1.27 5.17  Sodium 135 - 145 mmol/L 135 138 137  Potassium 3.5 - 5.1 mmol/L 4.0 3.6 4.2  Chloride 98 - 111 mmol/L 101 100 104  CO2 22 - 32 mmol/L 23 27 25   Calcium 8.9 - 10.3 mg/dL 9.7 9.1 9.1   CSF>WBC 0.01 and 187; Neutrophils 24 and 16; lymphocytes 72; monocytes 13; Glucose 81; protein 51  Summary  Paul Palmer is a 34 yo male with HIV that has not been treated for the past 5y who is admitted to IMTS for progressive headache with photophobia.  Assessment & Plan:  Principal Problem:   Acute lymphocytic meningitis Active Problems:   HIV disease (HCC)  Untreated HIV. CD4 203. Headache with photophobia. Acute lymphocytic meningitis vs general migraine headache. Given the length of time of ongoing migraine in addition to abnormal CSF analysis, suspect findings are attributable to a common viral infection. The headache present when seen by admitting provider (after LP) and still present this morning is positional--worsened by sitting up, improved with laying down--which brings up the possibility that there may also be a component of a spinal headache as well. Unclear if positional aspect was present prior to LP NGTD on CSF culture; gram stain neg. ID consulted>Given CD4 count, unlikely to be susceptible to opportunistic infection. Plan biktarvy started 3/30 per ID Will need close follow up with ID on discharge Migraine cocktail for headache.  Will try to avoid dilaudid for headache treatment.  Skin rash. Located on back, chest and arms. Pruritic in nature. Pt notes prior similar issue in the past at which time ID gave him something that worked well. Suspect tinea corporis. Will try to reach out to ID later today to discuss prior to starting treatment.  Best practice:  CODE STATUS: full Diet: reg DVT for prophylaxis: lovenox Dispo: would likely be stable for discharge if headache  resolves. Will need close follow up with the ID clinic as well as follow up on CMV, EBV and CSF cultures.   Mitzi Hansen, MD INTERNAL MEDICINE RESIDENT PGY-1 PAGER #: [phone] 01/31/20  5:16 AM

## 2020-01-31 NOTE — Discharge Instructions (Signed)
I

## 2020-01-31 NOTE — Progress Notes (Signed)
Patient ID: Paul Palmer, male   DOB: 15-Sep-1986, 34 y.o.   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  I expect that he will continue to improve with time and can be discharged home with symptomatic therapy.  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  We will get him his first month supply of Biktarvy to take home and arrange for him to follow-up in clinic with me in 3 to 4 weeks.  I will treat him again for presumed tinea versicolor with oral fluconazole.  PLAN: 1. Continue Biktarvy 2. Fluconazole 300 mg weekly for 2 weeks 3. [facility] follow-up with me on 02/22/2020  Principal Problem:   Acute lymphocytic meningitis Active Problems:   HIV disease (HCC)   Tinea versicolor   Scheduled Meds: . bictegravir-emtricitabine-tenofovir AF  1 tablet Oral Daily  . enoxaparin (LOVENOX) injection  40 mg Subcutaneous Q24H  . sodium chloride flush  3 mL Intravenous Once   Continuous Infusions: . lactated ringers 75 mL/hr at 01/31/20 0433   PRN Meds:.acetaminophen **OR** acetaminophen, ondansetron **OR** ondansetron (ZOFRAN) IV   SUBJECTIVE: He was feeling better when he got up this morning but developed worsening headache while taking a shower.  His first dose of Biktarvy well.  He mentioned to Dr. Ephriam Knuckles that he has a slightly pruritic rash and come back on his trunk and arms.  Review of Systems: Review of Systems  Constitutional: Negative for fever.  Skin: Positive for itching and rash.  Neurological: Positive for headaches.    No Known Allergies  OBJECTIVE: Vitals:   01/30/20 2030 01/31/20 0014 01/31/20 0438 01/31/20 0915  BP: (!) 138/98 135/88 123/74 122/71  Pulse: 66 (!) 51 (!) 52 (!) 56  Resp: 16 18 18 18   Temp: 98.2 F (36.8 C) 98.2 F (36.8 C) 98.2 F (36.8 C)  98.4 F (36.9 C)  TempSrc: Oral   Oral  SpO2: 100% 98% 100% 100%  Weight:      Height:       Body mass index is 20.96 kg/m.  Physical Exam Constitutional:      Comments: He is resting quietly in a darkened room.     Lab Results Lab Results  Component Value Date   WBC 4.9 01/31/2020   HGB 15.1 01/31/2020   HCT 41.3 01/31/2020   MCV 91.6 01/31/2020   PLT 234 01/31/2020    Lab Results  Component Value Date   CREATININE 0.88 01/31/2020   BUN 7 01/31/2020   NA 133 (L) 01/31/2020   K 4.0 01/31/2020   CL 103 01/31/2020   CO2 22 01/31/2020    Lab Results  Component Value Date   ALT 14 01/31/2020   AST 11 (L) 01/31/2020   ALKPHOS 43 01/31/2020   BILITOT 1.5 (H) 01/31/2020    HIV 1 RNA Quant (copies/mL)  Date Value  01/30/2020 5,790  03/24/2016 52 (H)  12/30/2015 2,951 (H)   CD4 T Cell Abs (/uL)  Date Value  01/30/2020 203 (L)  03/24/2016 590  12/30/2015 540   Microbiology: Recent Results (from the past 240 hour(s))  Culture, blood (routine x 2)     Status: None (Preliminary result)   Collection Time: 01/30/20  1:00 AM   Specimen: BLOOD RIGHT FOREARM  Result Value Ref Range Status  Specimen Description BLOOD RIGHT FOREARM  Final   Special Requests   Final    BOTTLES DRAWN AEROBIC AND ANAEROBIC Blood Culture adequate volume   Culture   Final    NO GROWTH < 24 HOURS Performed at [facility], [address] [place], [place] [address]    Report Status PENDING  Incomplete  Culture, blood (routine x 2)     Status: None (Preliminary result)   Collection Time: 01/30/20  1:31 AM   Specimen: BLOOD  Result Value Ref Range Status   Specimen Description BLOOD RIGHT ANTECUBITAL  Final   Special Requests   Final    BOTTLES DRAWN AEROBIC AND ANAEROBIC Blood Culture adequate volume   Culture   Final    NO GROWTH < 24 HOURS Performed at [facility], [place] [address], [place], [place] [address]    Report Status PENDING  Incomplete  SARS CORONAVIRUS 2  (TAT 6-24 HRS) Nasopharyngeal Nasopharyngeal Swab     Status: None   Collection Time: 01/30/20  2:25 AM   Specimen: Nasopharyngeal Swab  Result Value Ref Range Status   SARS Coronavirus 2 NEGATIVE NEGATIVE Final    Comment: (NOTE) SARS-CoV-2 target nucleic acids are NOT DETECTED. The SARS-CoV-2 RNA is generally detectable in upper and lower respiratory specimens during the acute phase of infection. Negative results do not preclude SARS-CoV-2 infection, do not rule out co-infections with other pathogens, and should not be used as the sole basis for treatment or other patient management decisions. Negative results must be combined with clinical observations, patient history, and epidemiological information. The expected result is Negative. Fact Sheet for Patients: SugarRoll.be Fact Sheet for Healthcare Providers: https://www.woods-mathews.com/ This test is not yet approved or cleared by the [place] FDA and  has been authorized for detection and/or diagnosis of SARS-CoV-2 by FDA under an Emergency Use Authorization (EUA). This EUA will remain  in effect (meaning this test can be used) for the duration of the COVID-19 declaration under Section 56 4(b)(1) of the Act, 21 U.S.C. section 360bbb-3(b)(1), unless the authorization is terminated or revoked sooner. Performed at [facility], [place] [address] [place], [place] [address]   CSF culture     Status: None (Preliminary result)   Collection Time: 01/30/20  5:08 AM   Specimen: CSF; Cerebrospinal Fluid  Result Value Ref Range Status   Specimen Description CSF  Final   Special Requests TUBE 2  Final   Gram Stain   Final    WBC PRESENT,BOTH PMN AND MONONUCLEAR NO ORGANISMS SEEN CYTOSPIN SMEAR    Culture   Final    NO GROWTH 1 DAY Performed at [facility], [place] [address] [place], [place] [address]    Report Status PENDING  Incomplete  Respiratory Panel by PCR     Status:  None   Collection Time: 01/30/20 11:36 AM   Specimen: Nasopharyngeal Swab; Respiratory  Result Value Ref Range Status   Adenovirus NOT DETECTED NOT DETECTED Final   Coronavirus 229E NOT DETECTED NOT DETECTED Final    Comment: (NOTE) The Coronavirus on the Respiratory Panel, DOES NOT test for the novel  Coronavirus (2019 nCoV)    Coronavirus HKU1 NOT DETECTED NOT DETECTED Final   Coronavirus NL63 NOT DETECTED NOT DETECTED Final   Coronavirus OC43 NOT DETECTED NOT DETECTED Final   Metapneumovirus NOT DETECTED NOT DETECTED Final   Rhinovirus / Enterovirus NOT DETECTED NOT DETECTED Final   Influenza A NOT DETECTED NOT DETECTED Final  Influenza B NOT DETECTED NOT DETECTED Final   Parainfluenza Virus 1 NOT DETECTED NOT DETECTED Final   Parainfluenza Virus 2 NOT DETECTED NOT DETECTED Final   Parainfluenza Virus 3 NOT DETECTED NOT DETECTED Final   Parainfluenza Virus 4 NOT DETECTED NOT DETECTED Final   Respiratory Syncytial Virus NOT DETECTED NOT DETECTED Final   Bordetella pertussis NOT DETECTED NOT DETECTED Final   Chlamydophila pneumoniae NOT DETECTED NOT DETECTED Final   Mycoplasma pneumoniae NOT DETECTED NOT DETECTED Final    Comment: Performed at [facility], [address] [place], [place] [address]    Cliffton Asters, MD [facility] [phone] pager   [phone] cell 01/31/2020, 12:37 PM

## 2020-01-31 NOTE — Progress Notes (Addendum)
ID Pharmacy Note   Mr. Vanscyoc has been approved for a 30-day fill of Biktarvy from [employer]. Billing information as listed below:  BIN#: [id number] PCN: [id number] GROUP: [id number] MEMBER#: [id number]  We have also submitted an application for up to 3 months - 1 year of approval of patient assistance for Biktarvy and will update the patient on the results of that submission.   Sharin Mons, PharmD, BCPS, BCIDP Infectious Diseases Clinical Pharmacist Phone: [phone] 01/31/2020 11:46 AM

## 2020-01-31 NOTE — TOC Transition Note (Signed)
Transition of Care [facility]) - CM/SW Discharge Note   Patient Details  Name: Paul Palmer MRN: [id number] Date of Birth: Dec 02, 1985  [facility] of [facility]) CM/SW Contact:  Bess Kinds, RN Phone Number: [phone] 01/31/2020, 3:49 PM   Clinical Narrative:    Spoke with patient at the bedside. Discharge medications had been delivered to bedside. Patient verbalized follow up with Dr. Orvan Falconer next month and that paperwork for his medications had been completed. He states that he has a PCP. He states that he has a ride home. No TOC needs identified.    Final next level of care: Home/Self Care Barriers to Discharge: No Barriers Identified   Patient Goals and CMS Choice        Discharge Placement                       Discharge Plan and Services                                     Social Determinants of Health (SDOH) Interventions     Readmission Risk Interventions No flowsheet data found.

## 2020-02-01 LAB — HSV CULTURE AND TYPING

## 2020-02-01 LAB — EPSTEIN BARR VRS(EBV DNA BY PCR)
EBV DNA QN by PCR: NEGATIVE copies/mL
log10 EBV DNA Qn PCR: UNDETERMINED log10 copy/mL

## 2020-02-01 LAB — CMV DNA, QUANTITATIVE, PCR
CMV DNA Quant: POSITIVE IU/mL
Log10 CMV Qn DNA Pl: UNDETERMINED log10 IU/mL

## 2020-02-01 NOTE — Discharge Summary (Signed)
Name: Paul Palmer MRN: [id number] DOB: September 25, 1986 34 y.o. PCP: Shelva Majestic, MD  Date of Admission: 01/29/2020  5:14 PM Date of Discharge: 01/31/2020 Attending Physician: No att. providers found  Discharge Diagnosis: 1. Acute lymphocytic meningitis 2. Untreated HIV 3. Tinea versicolor  Discharge Medications: Allergies as of 01/31/2020   No Known Allergies     Medication List    STOP taking these medications   elvitegravir-cobicistat-emtricitabine-tenofovir 150-150-200-10 MG Tabs tablet Commonly known as: GENVOYA     TAKE these medications   Biktarvy 50-200-25 MG Tabs tablet Generic drug: bictegravir-emtricitabine-tenofovir AF Take 1 tablet by mouth daily.   fluconazole 150 MG tablet Commonly known as: DIFLUCAN Take 2 tablets (300 mg total) by mouth once a week.   ibuprofen 800 MG tablet Commonly known as: ADVIL Take 800 mg by mouth daily as needed for fever.       Disposition and follow-up:   Paul Palmer was discharged from [facility] in Stable condition.  At the hospital follow up visit please address:  1.  Untreated HIV. Pt notes that he was previously receiving treatment with Genvoya up until 5 years ago at which time he no longer followed with ID clinic. ID was consulted and he was restarted on Biktarvy during this hospital admission with strict instructions to follow up in ID.   2. Tinea versicolor. Started on fluconazole.  3. Acute lymphocytic meningitis. Suspected to be virally related and should improve with symptomatic treatment.   2.  Labs / imaging needed at time of follow-up: none  3.  Pending labs/ test needing follow-up: CSF culture, GC/Chlamydia, CMV, EBV  Follow-up Appointments: Follow-up Information    [facility] Follow up.   Why: Call tomorrow morning after 9a to schedule appointment to be reestablsihed and placed back on anti-viral medications Contact information: [address] [place], [place] [address] [phone]       Shelva Majestic, MD Follow up.   Specialty: Family Medicine Contact information: [address] [place] [address] [phone]        Gardiner Barefoot, MD .   Specialty: Infectious Diseases Contact information: [address] [place] [address] [phone]           Hospital Course: Paul Palmer is a 34 yo male with a PMH of untreated HIV who presented to [facility] ED on 3/30 for progressive headache with photophobia and was admitted to the internal medicine service for continued evaluation.  Head CT on admission did not reveal acute findings. He underwent an LP which was consistent with a lymphocytic pleocytosis. Gram stain, VRDL, HSV, and cryptococcal antigen were negative. CMV and EBV remained pending at hospital discharge.   Infectious disease was consulted. CD4 count on this admission was 203. Due to this, it was not felt that he was at high risk for opportunistic infections and empiric antibiotics/antivirals were discontinued.  He was started on Biktarvy for his untreated HIV. Pt had been receiving treatment with genvoya until about 5 years ago when he fell out of touch with the ID clinic.  He has been re-established with ID and is instructed to follow up with them after discharge.   At time of discharge, patient noted some improvement in his headache although not entirely resolved. He was instructed to continue symptomatic treatment.  Additionally, pt noted a rash on his back, chest and arms that was pruritic that was consistent with tinea versicolor. He was started on  oral fluconazole by ID for this.  Discharge Vitals:   BP 122/71 (BP Location: Left Arm)   Pulse (!) 56   Temp 98.4 F (36.9 C) (Oral)   Resp 18   Ht 6\' 4"  (1.93 m)   Wt 78.1 kg   SpO2 100%   BMI 20.96 kg/m   Pertinent Labs, Studies, and Procedures:  CBC Latest Ref Rng & Units 01/31/2020 01/29/2020 08/10/2018  WBC  4.0 - 10.5 K/uL 4.9 7.7 3.7(L)  Hemoglobin 13.0 - 17.0 g/dL 15.1 17.3(H) 16.2  Hematocrit 39.0 - 52.0 % 41.3 47.3 46.0  Platelets 150 - 400 K/uL 234 273 276   BMP Latest Ref Rng & Units 01/31/2020 01/29/2020 08/10/2018  Glucose 70 - 99 mg/dL 89 131(H) 99  BUN 6 - 20 mg/dL 7 5(L) 8  Creatinine 0.61 - 1.24 mg/dL 0.88 0.96 0.99  Sodium 135 - 145 mmol/L 133(L) 135 138  Potassium 3.5 - 5.1 mmol/L 4.0 4.0 3.6  Chloride 98 - 111 mmol/L 103 101 100  CO2 22 - 32 mmol/L 22 23 27   Calcium 8.9 - 10.3 mg/dL 8.7(L) 9.7 9.1   CSF cell count -RBC 3 -WBC 187; neutrophils 16%, lymphocytes 71%, monocytes 13%  Head CT> no acute findings   Signed: Mitzi Hansen, MD 02/01/2020, 7:52 AM   Pager: [phone]

## 2020-02-02 LAB — CSF CULTURE W GRAM STAIN: Culture: NO GROWTH

## 2020-02-03 ENCOUNTER — Emergency Department (HOSPITAL_COMMUNITY): Payer: Self-pay

## 2020-02-03 ENCOUNTER — Emergency Department (HOSPITAL_COMMUNITY)
Admission: EM | Admit: 2020-02-03 | Discharge: 2020-02-03 | Disposition: A | Discharge status: Home / Self Care | Payer: Self-pay | Attending: Emergency Medicine | Admitting: Emergency Medicine

## 2020-02-03 ENCOUNTER — Other Ambulatory Visit: Payer: Self-pay

## 2020-02-03 DIAGNOSIS — R110 Nausea: Secondary | ICD-10-CM

## 2020-02-03 DIAGNOSIS — Z8661 Personal history of infections of the central nervous system: Secondary | ICD-10-CM | POA: Insufficient documentation

## 2020-02-03 DIAGNOSIS — R112 Nausea with vomiting, unspecified: Secondary | ICD-10-CM | POA: Insufficient documentation

## 2020-02-03 DIAGNOSIS — Z87891 Personal history of nicotine dependence: Secondary | ICD-10-CM | POA: Insufficient documentation

## 2020-02-03 DIAGNOSIS — R519 Headache, unspecified: Secondary | ICD-10-CM | POA: Insufficient documentation

## 2020-02-03 DIAGNOSIS — Z21 Asymptomatic human immunodeficiency virus [HIV] infection status: Secondary | ICD-10-CM | POA: Insufficient documentation

## 2020-02-03 LAB — URINALYSIS, ROUTINE W REFLEX MICROSCOPIC
Bilirubin Urine: NEGATIVE
Glucose, UA: NEGATIVE mg/dL
Hgb urine dipstick: NEGATIVE
Ketones, ur: 5 mg/dL — AB
Leukocytes,Ua: NEGATIVE
Nitrite: NEGATIVE
Protein, ur: NEGATIVE mg/dL
Specific Gravity, Urine: 1.009 (ref 1.005–1.030)
pH: 9 — ABNORMAL HIGH (ref 5.0–8.0)

## 2020-02-03 LAB — COMPREHENSIVE METABOLIC PANEL
ALT: 14 U/L (ref 0–44)
AST: 16 U/L (ref 15–41)
Albumin: 4.2 g/dL (ref 3.5–5.0)
Alkaline Phosphatase: 47 U/L (ref 38–126)
Anion gap: 9 (ref 5–15)
BUN: 7 mg/dL (ref 6–20)
CO2: 25 mmol/L (ref 22–32)
Calcium: 9.1 mg/dL (ref 8.9–10.3)
Chloride: 104 mmol/L (ref 98–111)
Creatinine, Ser: 0.87 mg/dL (ref 0.61–1.24)
GFR calc Af Amer: >60 mL/min (ref >60)
GFR calc non Af Amer: >60 mL/min (ref >60)
Glucose, Bld: 96 mg/dL (ref 70–99)
Potassium: 4.1 mmol/L (ref 3.5–5.1)
Sodium: 138 mmol/L (ref 135–145)
Total Bilirubin: 1 mg/dL (ref 0.3–1.2)
Total Protein: 7.6 g/dL (ref 6.5–8.1)

## 2020-02-03 LAB — RAPID URINE DRUG SCREEN, HOSP PERFORMED
Amphetamines: NOT DETECTED
Barbiturates: POSITIVE — AB
Benzodiazepines: NOT DETECTED
Cocaine: NOT DETECTED
Opiates: NOT DETECTED
Tetrahydrocannabinol: POSITIVE — AB

## 2020-02-03 LAB — CBC WITH DIFFERENTIAL/PLATELET
Abs Immature Granulocytes: 0.01 10*3/uL (ref 0.00–0.07)
Basophils Absolute: 0 10*3/uL (ref 0.0–0.1)
Basophils Relative: 0 %
Eosinophils Absolute: 0 10*3/uL (ref 0.0–0.5)
Eosinophils Relative: 1 %
HCT: 46.1 % (ref 39.0–52.0)
Hemoglobin: 16.5 g/dL (ref 13.0–17.0)
Immature Granulocytes: 0 %
Lymphocytes Relative: 20 %
Lymphs Abs: 0.9 10*3/uL (ref 0.7–4.0)
MCH: 32.5 pg (ref 26.0–34.0)
MCHC: 35.8 g/dL (ref 30.0–36.0)
MCV: 90.9 fL (ref 80.0–100.0)
Monocytes Absolute: 0.2 10*3/uL (ref 0.1–1.0)
Monocytes Relative: 5 %
Neutro Abs: 3.2 10*3/uL (ref 1.7–7.7)
Neutrophils Relative %: 74 %
Platelets: 266 10*3/uL (ref 150–400)
RBC: 5.07 MIL/uL (ref 4.22–5.81)
RDW: 12.3 % (ref 11.5–15.5)
WBC: 4.4 10*3/uL (ref 4.0–10.5)
nRBC: 0 % (ref 0.0–0.2)

## 2020-02-03 LAB — TROPONIN I (HIGH SENSITIVITY)
Troponin I (High Sensitivity): <2 ng/L (ref <18)
Troponin I (High Sensitivity): <2 ng/L (ref <18)

## 2020-02-03 MED ORDER — ONDANSETRON HCL 4 MG PO TABS: 4.0000 mg | ORAL_TABLET | Freq: Four times a day (QID) | ORAL | 0 refills | Status: DC

## 2020-02-03 MED ORDER — ONDANSETRON HCL 4 MG/2ML IJ SOLN
4.0000 mg | Freq: Once | INTRAMUSCULAR | Status: AC
  Administered 2020-02-03: 4 mg via INTRAVENOUS
  Filled 2020-02-03: qty 2

## 2020-02-03 MED ORDER — SODIUM CHLORIDE 0.9 % IV BOLUS
1000.0000 mL | Freq: Once | INTRAVENOUS | Status: AC
  Administered 2020-02-03: 1000 mL via INTRAVENOUS

## 2020-02-03 MED ORDER — ACETAMINOPHEN 500 MG PO TABS
1000.0000 mg | ORAL_TABLET | Freq: Once | ORAL | Status: AC
  Administered 2020-02-03: 1000 mg via ORAL
  Filled 2020-02-03: qty 2

## 2020-02-03 NOTE — ED Notes (Signed)
Urine culture sent down to lab with urinalysis. 

## 2020-02-03 NOTE — ED Provider Notes (Signed)
[facility]-EMERGENCY DEPT Provider Note   CSN: [id number] Arrival date & time: 02/03/20  1148     History Chief Complaint  Patient presents with  . viral meningitis  . Chest Pain    Paul Palmer is a 34 y.o. male.  34 year old male with prior medical history as detailed below presents for evaluation of headache, nausea, vomiting.  Patient was discharged 3 days prior from [facility].  Patient was diagnosed at that time with poorly controlled HIV and acute lymphocytic meningitis.  Patient was advised that he would need to have symptomatic control of his headache and nausea at home.  Patient reports that his symptoms were doing well until this morning when he developed worsening headache and had nausea and vomiting associated with this.  He denies fever.  He denies chest pain or shortness of breath to this provider.  The history is provided by the patient and medical records.  Illness Location:  Headache, nausea, vomiting Severity:  Moderate Onset quality:  Gradual Duration:  1 day Timing:  Constant Chronicity:  New Associated symptoms: headaches   Associated symptoms: no fever        Past Medical History:  Diagnosis Date  . HIV (human immunodeficiency virus infection) [facility])     Patient Active Problem List   Diagnosis Date Noted  . Acute lymphocytic meningitis 01/30/2020  . Chlamydia 01/22/2016  . History of gonorrhea 01/22/2016  . Former smoker 08/23/2014  . Encounter for long-term (current) use of medications 08/22/2014  . Tinea versicolor 08/22/2014  . 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 Healthcare maintenance 09/05/2013  . Seasonal allergies 09/06/2012  . HIV disease (HCC) 07/23/2011    Past Surgical History:  Procedure Laterality Date  . none         Family History  Problem Relation Age of Onset  . Hypertension Mother        and grandparents  . Diabetes Other        grandparents (deceased)    Social History   Tobacco  Use  . Smoking status: Former Smoker    Packs/day: 0.25    Years: 9.00    Pack years: 2.25    Types: Cigarettes    Start date: 08/22/2005  . Smokeless tobacco: Never Used  . Tobacco comment: congratulated!!!  Substance Use Topics  . Alcohol use: Yes    Alcohol/week: 0.0 standard drinks    Comment: occ  . Drug use: Yes    Frequency: 7.0 times per week    Types: Marijuana    Home Medications Prior to Admission medications   Medication Sig Start Date End Date Taking? Authorizing Provider  bictegravir-emtricitabine-tenofovir AF (BIKTARVY) 50-200-25 MG TABS tablet Take 1 tablet by mouth daily. 01/31/20 01/25/21  Gust Rung, DO  fluconazole (DIFLUCAN) 150 MG tablet Take 2 tablets (300 mg total) by mouth once a week. 01/31/20   Elige Radon, MD  ibuprofen (ADVIL) 800 MG tablet Take 800 mg by mouth daily as needed for fever.     [provider]    Allergies    Patient has no known allergies.  Review of Systems   Review of Systems  Constitutional: Negative for fever.  Neurological: Positive for headaches.  All other systems reviewed and are negative.   Physical Exam Updated Vital Signs BP 127/81 (BP Location: Right Arm)   Pulse 64   Temp 98.1 F (36.7 C) (Oral)   Resp 18   Ht 6\' 4"  (1.93 m)   Wt 78.1  kg   SpO2 100%   BMI 20.96 kg/m   Physical Exam Vitals and nursing note reviewed.  Constitutional:      General: He is not in acute distress.    Appearance: He is well-developed.  HENT:     Head: Normocephalic and atraumatic.  Eyes:     Conjunctiva/sclera: Conjunctivae normal.     Pupils: Pupils are equal, round, and reactive to light.  Cardiovascular:     Rate and Rhythm: Normal rate and regular rhythm.     Heart sounds: Normal heart sounds.  Pulmonary:     Effort: Pulmonary effort is normal. No respiratory distress.     Breath sounds: Normal breath sounds.  Abdominal:     General: There is no distension.     Palpations: Abdomen is soft.      Tenderness: There is no abdominal tenderness.  Musculoskeletal:        General: No deformity. Normal range of motion.     Cervical back: Normal range of motion and neck supple.  Skin:    General: Skin is warm and dry.  Neurological:     Mental Status: He is alert and oriented to person, place, and time.     ED Results / Procedures / Treatments   Labs (all labs ordered are listed, but only abnormal results are displayed) Labs Reviewed  RAPID URINE DRUG SCREEN, HOSP PERFORMED - Abnormal; Notable for the following components:      Result Value   Tetrahydrocannabinol POSITIVE (*)    Barbiturates POSITIVE (*)    All other components within normal limits  URINALYSIS, ROUTINE W REFLEX MICROSCOPIC - Abnormal; Notable for the following components:   pH 9.0 (*)    Ketones, ur 5 (*)    All other components within normal limits  COMPREHENSIVE METABOLIC PANEL  CBC WITH DIFFERENTIAL/PLATELET  TROPONIN I (HIGH SENSITIVITY)  TROPONIN I (HIGH SENSITIVITY)    EKG EKG Interpretation  Date/Time:  Saturday February 03 2020 11:57:00 EDT Ventricular Rate:  58 PR Interval:    QRS Duration: 91 QT Interval:  384 QTC Calculation: 378 R Axis:   84 Text Interpretation: Sinus rhythm LVH by voltage ST elev, probable normal early repol pattern Artifact in lead(s) V5 V6 12 Lead; Mason-Likar Confirmed by Dene Gentry [phone]) on 02/03/2020 12:26:21 PM   Radiology DG Chest Port 1 View  Result Date: 02/03/2020 CLINICAL DATA:  Viral meningitis.  Chest tightness. EXAM: PORTABLE CHEST 1 VIEW COMPARISON:  January 30, 2020 FINDINGS: Cardiomediastinal silhouette is normal. Mediastinal contours appear intact. There is no evidence of focal airspace consolidation, pleural effusion or pneumothorax. Osseous structures are without acute abnormality. Soft tissues are grossly normal. IMPRESSION: No active disease. Electronically Signed   By: Fidela Salisbury M.D.   On: 02/03/2020 12:50    Procedures Procedures  (including critical care time)  Medications Ordered in ED Medications  sodium chloride 0.9 % bolus 1,000 mL (0 mLs Intravenous Stopped 02/03/20 1503)  ondansetron (ZOFRAN) injection 4 mg (4 mg Intravenous Given 02/03/20 1249)  acetaminophen (TYLENOL) tablet 1,000 mg (1,000 mg Oral Given 02/03/20 1249)  sodium chloride 0.9 % bolus 1,000 mL (1,000 mLs Intravenous New Bag/Given 02/03/20 1503)    ED Course  I have reviewed the triage vital signs and the nursing notes.  Pertinent labs & imaging results that were available during my care of the patient were reviewed by me and considered in my medical decision making (see chart for details).    MDM Rules/Calculators/A&P  MDM  Screen complete  Paul Palmer was evaluated in Emergency Department on 02/03/2020 for the symptoms described in the history of present illness. He was evaluated in the context of the global COVID-19 pandemic, which necessitated consideration that the patient might be at risk for infection with the SARS-CoV-2 virus that causes COVID-19. Institutional protocols and algorithms that pertain to the evaluation of patients at risk for COVID-19 are in a state of rapid change based on information released by regulatory bodies including the CDC and federal and state organizations. These policies and algorithms were followed during the patient's care in the ED.  Patient is presenting for re-evaluation.  He is reporting continued headache with associated nausea and vomiting.  He did not take anything for his symptoms at home.  ED work-up does not suggest new or acute pathology.  Following IV fluids, antiemetics, and Tylenol for his pain and nausea resolved.  He now feels significantly better.    He declines readmission.  He is aware of the need for close follow-up.  Strict return precautions given and understood.    Final Clinical Impression(s) / ED Diagnoses Final diagnoses:  Nausea    Rx / DC Orders ED  Discharge Orders         Ordered    ondansetron (ZOFRAN) 4 MG tablet  Every 6 hours     02/03/20 1617           Wynetta Fines, MD 02/03/20 [phone]

## 2020-02-03 NOTE — ED Notes (Signed)
Patient is aware that urine sample is needed. Urinal at bedside. 

## 2020-02-03 NOTE — ED Triage Notes (Signed)
Patient reports he was diagnosed with viral meningitis and released from [facility] on Wednesday. Patient states since then, the headache, nausea, vomiting has gotten worse. Patient states he now feels chest tightness.

## 2020-02-03 NOTE — Discharge Instructions (Addendum)
Please return for any problem.  Follow-up with your regular care providers as instructed.  Drink plenty of fluids.  Take Tylenol for headache or other pain.  Use the Zofran as prescribed for nausea.

## 2020-02-04 LAB — CULTURE, BLOOD (ROUTINE X 2)
Culture: NO GROWTH
Culture: NO GROWTH
Special Requests: ADEQUATE
Special Requests: ADEQUATE

## 2020-02-05 ENCOUNTER — Telehealth: Payer: Self-pay | Admitting: Pharmacy Technician

## 2020-02-05 NOTE — Telephone Encounter (Signed)
[facility] Patient Advocate Encounter  Completed and sent [employer] Advancing Access application for Biktarvy for this patient who is uninsured.    Patient is approved 01/31/2020 for up to 12 months.  BIN      [id number] PCN    [id number] GRP    [id number] ID        [id number]   Kathie Rhodes E. Dimas Aguas CPhT [facility] Patient [facility] Phone: [phone] Fax:  [phone]

## 2020-02-08 ENCOUNTER — Telehealth: Payer: Self-pay

## 2020-02-08 NOTE — Telephone Encounter (Signed)
Patient called office today with concerns regarding recent hospital admission. States he has not noticed any improvements in symptoms. Is having dizzy spells even while lying down. As well as pressure/stiffness in back,neck, and head.  Was instructed to take tylenol for symptoms while being discharged. Has not noticed any changes. Would like to know if MD has any advice on what he should do regarding symptoms. Does not want to go back to ED due to two recent visits. Would like a call back to discuss further with Md. Lorenso Courier, [place]

## 2020-02-08 NOTE — Telephone Encounter (Signed)
Tell him he can take ibuprofen and extra strength Tylenol alternating every 4 hours.  Not to take more of either wine than instructed on the bottle.  Also reminded him to not miss a single dose of his Biktarvy.

## 2020-02-08 NOTE — Telephone Encounter (Signed)
Relayed message per MD. Patient verbalized understanding. Paul Palmer, [place]

## 2020-02-11 MED ORDER — CLOTRIMAZOLE 1 % EX CREA
TOPICAL_CREAM | CUTANEOUS | Status: DC
Start: 2020-02-11 — End: 2020-02-11

## 2020-02-11 MED ORDER — MECLIZINE HCL 12.5 MG PO TABS
25.00 | ORAL_TABLET | ORAL | Status: DC
Start: ? — End: 2020-02-11

## 2020-02-11 MED ORDER — ENOXAPARIN SODIUM 40 MG/0.4ML ~~LOC~~ SOLN
40.00 | SUBCUTANEOUS | Status: DC
Start: 2020-02-11 — End: 2020-02-11

## 2020-02-11 MED ORDER — LIDOCAINE HCL 1 % IJ SOLN
0.50 | INTRAMUSCULAR | Status: DC
Start: ? — End: 2020-02-11

## 2020-02-11 MED ORDER — BICTEGRAVIR-EMTRICITAB-TENOFOV 50-200-25 MG PO TABS
1.00 | ORAL_TABLET | ORAL | Status: DC
Start: 2020-02-12 — End: 2020-02-11

## 2020-02-11 MED ORDER — ACETAMINOPHEN 325 MG PO TABS
650.00 | ORAL_TABLET | ORAL | Status: DC
Start: ? — End: 2020-02-11

## 2020-02-11 MED ORDER — KETOCONAZOLE 2 % EX CREA
TOPICAL_CREAM | CUTANEOUS | Status: DC
Start: 2020-02-12 — End: 2020-02-11

## 2020-02-12 ENCOUNTER — Encounter: Payer: Self-pay | Admitting: Internal Medicine

## 2020-02-12 ENCOUNTER — Telehealth: Payer: Self-pay | Admitting: *Deleted

## 2020-02-12 NOTE — Telephone Encounter (Signed)
Patient called to see if he could take Vitamin D3 (2,000 international units daily) and Black Elderberry (12,000 mg daily) with Biktarvy. Per Pharmacy, these are fine.  RN transferred patient to Paul Palmer in [facility] to start his HMAP/Ryan White, confirmed follow up 4/22. He is currently in [place] with plans to move to [place]. He will sign a release of information when he comes to his next appointment. Andree Coss, RN

## 2020-02-22 ENCOUNTER — Other Ambulatory Visit: Payer: Self-pay

## 2020-02-22 ENCOUNTER — Ambulatory Visit (INDEPENDENT_AMBULATORY_CARE_PROVIDER_SITE_OTHER): Payer: Self-pay | Admitting: Internal Medicine

## 2020-02-22 ENCOUNTER — Encounter: Payer: Self-pay | Admitting: Internal Medicine

## 2020-02-22 DIAGNOSIS — B20 Human immunodeficiency virus [HIV] disease: Secondary | ICD-10-CM

## 2020-02-22 DIAGNOSIS — R42 Dizziness and giddiness: Secondary | ICD-10-CM

## 2020-02-22 DIAGNOSIS — A872 Lymphocytic choriomeningitis: Secondary | ICD-10-CM

## 2020-02-22 NOTE — Assessment & Plan Note (Signed)
His dizziness is secondary to his recent meningitis and I told him that it should resolve soon.

## 2020-02-22 NOTE — Assessment & Plan Note (Signed)
His lymphocytic meningitis is resolving spontaneously.

## 2020-02-22 NOTE — Assessment & Plan Note (Signed)
As expected, his infection reactivated after stopping Genvoya several years ago and he had some decline in his CD4 count.  However his viral load had already become undetectable at less than 20 when he was seen at [facility] less than 2 weeks after starting Biktarvy.  I reminded him that if he ever has concerns about not being able to get his medication he should let us know right away.  I signed him up for MyChart.  I encouraged him to get the Covid vaccine.  He will follow-up here after lab work in 6 months.

## 2020-02-22 NOTE — Progress Notes (Signed)
Patient Active Problem List   Diagnosis Date Noted  . Dizziness 02/22/2020    Priority: High  . 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 HIV disease (HCC) 07/23/2011    Priority: High  . Chlamydia 01/22/2016    Priority: Medium  . History of gonorrhea 01/22/2016    Priority: Medium  . 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 Former smoker 08/23/2014  . Encounter for long-term (current) use of medications 08/22/2014  . Tinea versicolor 08/22/2014  . Healthcare maintenance 09/05/2013  . Seasonal allergies 09/06/2012    Patient's Medications  New Prescriptions   No medications on file  Previous Medications   BICTEGRAVIR-EMTRICITABINE-TENOFOVIR AF (BIKTARVY) 50-200-25 MG TABS TABLET    Take 1 tablet by mouth daily.   FLUCONAZOLE (DIFLUCAN) 150 MG TABLET    Take 2 tablets (300 mg total) by mouth once a week.   IBUPROFEN (ADVIL) 800 MG TABLET    Take 800 mg by mouth daily as needed for fever.    ONDANSETRON (ZOFRAN) 4 MG TABLET    Take 1 tablet (4 mg total) by mouth every 6 (six) hours.  Modified Medications   No medications on file  Discontinued Medications   No medications on file    Subjective: Paul Palmer is in for his hospital follow-up visit. He fell out of care and ran out of Genvoya shortly after his last visit with me in 2017.  He has been under the mistaken impression that we would be unable to get him a steady supply of Genvoya.  He was feeling well until 01/27/2020 when he had sudden onset of severe headache, nausea and vomiting.  He came to the ED and underwent lumbar puncture which revealed acute lymphocytic meningitis.  His glucose was normal and his protein was only slightly elevated.  CSF cryptococcal antigen was negative.  No organisms were seen on Gram stain.  Cultures were negative.  HSV PCR was negative.  I felt that he probably had aseptic meningitis due to common viral infection such as enterovirus.  His CD4 count  had fallen to 203 and his viral load had reactivated to 5790.  I started him on Biktarvy.  He had persistent headache after discharge and was admitted to [facility] on 02/09/2020.  Unfortunately he underwent a similar work-up.  CSF findings identical to what we found here.  He was discharged 2 days later.  His headache resolved about 1 week ago and he is feeling much better with the exception of some intermittent dizziness.  He believes the dizziness is getting better as well. He is now back at work.  He is tolerating Biktarvy well and has not missed a single dose.  Review of Systems: Review of Systems  Constitutional: Negative for chills, diaphoresis, fever, malaise/fatigue and weight loss.  HENT: Negative for sore throat.   Respiratory: Negative for cough, sputum production and shortness of breath.   Cardiovascular: Negative for chest pain.  Gastrointestinal: Negative for abdominal pain, diarrhea, heartburn, nausea and vomiting.  Genitourinary: Negative for dysuria and frequency.  Musculoskeletal: Negative for joint pain and myalgias.  Skin: Negative for rash.  Neurological: Positive for dizziness. Negative for headaches.  Psychiatric/Behavioral: Negative for depression and substance abuse. The patient is not nervous/anxious.     Past Medical History:  Diagnosis Date  . HIV (human immunodeficiency virus infection) (HCC)     Social History   Tobacco Use  .  Smoking status: Former Smoker    Packs/day: 0.25    Years: 9.00    Pack years: 2.25    Types: Cigarettes    Start date: 08/22/2005  . Smokeless tobacco: Never Used  . Tobacco comment: congratulated!!!  Substance Use Topics  . Alcohol use: Yes    Alcohol/week: 0.0 standard drinks    Comment: occ  . Drug use: Yes    Frequency: 7.0 times per week    Types: Marijuana    Family History  Problem Relation Age of Onset  . Hypertension Mother        and grandparents  . Diabetes Other        grandparents  (deceased)    No Known Allergies  Health Maintenance  Topic Date Due  . COVID-19 Vaccine (1) Never done  . TETANUS/TDAP  Never done  . INFLUENZA VACCINE  06/02/2020  . HIV Screening  Completed    Objective:  Vitals:   02/22/20 1624  BP: 131/84  Pulse: 77  Temp: 98.2 F (36.8 C)  SpO2: 99%  Weight: 178 lb (80.7 kg)  Height: 6\' 4"  (1.93 m)   Body mass index is 21.67 kg/m.  Physical Exam Constitutional:      Comments: He is in good spirits.  He is obviously feeling much better than when I saw him in the hospital.  Cardiovascular:     Rate and Rhythm: Normal rate and regular rhythm.     Heart sounds: No murmur.  Pulmonary:     Effort: Pulmonary effort is normal.     Breath sounds: Normal breath sounds.  Musculoskeletal:     Cervical back: Neck supple.  Skin:    Findings: No rash.  Neurological:     General: No focal deficit present.  Psychiatric:        Mood and Affect: Mood normal.     Lab Results Lab Results  Component Value Date   WBC 4.4 02/03/2020   HGB 16.5 02/03/2020   HCT 46.1 02/03/2020   MCV 90.9 02/03/2020   PLT 266 02/03/2020    Lab Results  Component Value Date   CREATININE 0.87 02/03/2020   BUN 7 02/03/2020   NA 138 02/03/2020   K 4.1 02/03/2020   CL 104 02/03/2020   CO2 25 02/03/2020    Lab Results  Component Value Date   ALT 14 02/03/2020   AST 16 02/03/2020   ALKPHOS 47 02/03/2020   BILITOT 1.0 02/03/2020    Lab Results  Component Value Date   CHOL 141 09/20/2014   HDL 33 (L) 09/20/2014   LDLCALC 92 09/20/2014   TRIG 78 09/20/2014   CHOLHDL 4.3 09/20/2014   Lab Results  Component Value Date   LABRPR NON REACTIVE 01/30/2020   RPRTITER 1:1 12/30/2015   HIV 1 RNA Quant (copies/mL)  Date Value  01/30/2020 5,790  03/24/2016 52 (H)  12/30/2015 2,951 (H)   CD4 T Cell Abs (/uL)  Date Value  01/30/2020 203 (L)  03/24/2016 590  12/30/2015 540     Problem List Items Addressed This Visit      High   HIV disease  (HCC)    As expected, his infection reactivated after stopping Genvoya several years ago and he had some decline in his CD4 count.  However his viral load had already become undetectable at less than 20 when he was seen at [facility] less than 2 weeks after starting Biktarvy.  I reminded him that if he ever has concerns about not  being able to get his medication he should let us know right away.  I signed him up for MyChart.  I encouraged him to get the Covid vaccine.  He will follow-up here after lab work in 6 months.      Relevant Orders   T-helper cell (CD4)- ([facility] clinic only)   HIV-1 RNA quant-no reflex-bld   RPR   Dizziness    His dizziness is secondary to his recent meningitis and I told him that it should resolve soon.      Acute lymphocytic meningitis    His lymphocytic meningitis is resolving spontaneously.           Michel Bickers, MD [facility] [phone] pager   [phone] cell 02/22/2020, 5:04 PM

## 2020-03-05 ENCOUNTER — Telehealth: Payer: Self-pay | Admitting: Pharmacy Technician

## 2020-03-05 ENCOUNTER — Other Ambulatory Visit: Payer: Self-pay | Admitting: Pharmacist

## 2020-03-05 DIAGNOSIS — B20 Human immunodeficiency virus [HIV] disease: Secondary | ICD-10-CM

## 2020-03-05 MED ORDER — BIKTARVY 50-200-25 MG PO TABS: 1.0000 | ORAL_TABLET | Freq: Every day | ORAL | 11 refills | Status: DC

## 2020-03-05 NOTE — Telephone Encounter (Signed)
[facility] Patient Advocate Encounter  Patient called this morning to let us know he was out of medication and needed his script transferred to [facility]. It appears that [facility] [place]. He is adap approved until 08/01/2020 so the script will need to be sent to an adap [facility] in the [place] area.  [address] [place], [place] [address] ([phone]  Patient was made aware of the pharmacy related policies of the program. I will contact him back once everything is transferred. His cell number [phone].

## 2020-06-25 ENCOUNTER — Telehealth: Payer: Self-pay | Admitting: *Deleted

## 2020-06-25 NOTE — Telephone Encounter (Signed)
Patient called, said the health department told him that he has been listed as a contact and needs to follow up with them. Patient asked if he could follow up here instead.  RN explained that we are unable to have walk in visits for STI testing at this time and advised him to follow up as the health department directed. Andree Coss, RN

## 2020-08-12 ENCOUNTER — Encounter: Payer: Self-pay | Admitting: Internal Medicine

## 2020-08-12 ENCOUNTER — Other Ambulatory Visit: Payer: Self-pay

## 2020-08-12 ENCOUNTER — Ambulatory Visit: Payer: Self-pay

## 2020-08-12 DIAGNOSIS — B20 Human immunodeficiency virus [HIV] disease: Secondary | ICD-10-CM

## 2020-08-13 LAB — T-HELPER CELL (CD4) - (RCID CLINIC ONLY)
CD4 % Helper T Cell: 32 % — ABNORMAL LOW (ref 33–65)
CD4 T Cell Abs: 498 /uL (ref 400–1790)

## 2020-08-14 LAB — HIV-1 RNA QUANT-NO REFLEX-BLD
HIV 1 RNA Quant: <20 Copies/mL
HIV-1 RNA Quant, Log: <1.3 Log cps/mL

## 2020-08-14 LAB — RPR: RPR Ser Ql: NONREACTIVE

## 2020-08-26 ENCOUNTER — Encounter: Payer: Self-pay | Admitting: Internal Medicine

## 2020-08-27 ENCOUNTER — Encounter: Payer: Self-pay | Admitting: Internal Medicine

## 2020-09-03 ENCOUNTER — Telehealth: Payer: Self-pay | Admitting: Internal Medicine

## 2020-09-03 ENCOUNTER — Other Ambulatory Visit: Payer: Self-pay

## 2020-11-08 ENCOUNTER — Ambulatory Visit: Payer: Self-pay | Admitting: Pharmacist

## 2020-11-12 ENCOUNTER — Ambulatory Visit: Payer: Self-pay

## 2020-11-12 ENCOUNTER — Telehealth: Payer: Self-pay

## 2020-11-12 ENCOUNTER — Other Ambulatory Visit: Payer: Self-pay

## 2020-11-12 ENCOUNTER — Encounter: Payer: Self-pay | Admitting: Internal Medicine

## 2020-11-12 ENCOUNTER — Ambulatory Visit (INDEPENDENT_AMBULATORY_CARE_PROVIDER_SITE_OTHER): Payer: Self-pay | Admitting: Internal Medicine

## 2020-11-12 VITALS — BP 122/72 | HR 86 | Temp 98.1°F | Ht 76.0 in | Wt 183.0 lb

## 2020-11-12 DIAGNOSIS — B20 Human immunodeficiency virus [HIV] disease: Secondary | ICD-10-CM

## 2020-11-12 DIAGNOSIS — A5149 Other secondary syphilitic conditions: Secondary | ICD-10-CM

## 2020-11-12 DIAGNOSIS — Z23 Encounter for immunization: Secondary | ICD-10-CM

## 2020-11-12 DIAGNOSIS — A872 Lymphocytic choriomeningitis: Secondary | ICD-10-CM

## 2020-11-12 MED ORDER — BIKTARVY 50-200-25 MG PO TABS: 1.0000 | ORAL_TABLET | Freq: Every day | ORAL | 11 refills | Status: AC

## 2020-11-12 NOTE — Progress Notes (Signed)
Patient Active Problem List   Diagnosis Date Noted  . Dizziness 02/22/2020    Priority: High  . 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 HIV disease (HCC) 07/23/2011    Priority: High  . Chlamydia 01/22/2016    Priority: Medium  . History of gonorrhea 01/22/2016    Priority: Medium  . 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 Former smoker 08/23/2014  . Encounter for long-term (current) use of medications 08/22/2014  . Tinea versicolor 08/22/2014  . Healthcare maintenance 09/05/2013  . Seasonal allergies 09/06/2012    Patient's Medications  New Prescriptions   No medications on file  Previous Medications   FLUCONAZOLE (DIFLUCAN) 150 MG TABLET    Take 2 tablets (300 mg total) by mouth once a week.   IBUPROFEN (ADVIL) 800 MG TABLET    Take 800 mg by mouth daily as needed for fever.    KETOCONAZOLE (NIZORAL) 2 % CREAM    Apply topically.  Modified Medications   Modified Medication Previous Medication   BICTEGRAVIR-EMTRICITABINE-TENOFOVIR AF (BIKTARVY) 50-200-25 MG TABS TABLET bictegravir-emtricitabine-tenofovir AF (BIKTARVY) 50-200-25 MG TABS tablet      Take 1 tablet by mouth daily.    Take 1 tablet by mouth daily.  Discontinued Medications   ONDANSETRON (ZOFRAN) 4 MG TABLET    Take 1 tablet (4 mg total) by mouth every 6 (six) hours.    Subjective: Paul Palmer is in for his routine HIV follow-up visit. He says he has not had any problems obtaining, taking or tolerating his Biktarvy. He recalls only missing 2 or 3 doses since his last visit. That occurred when he had a stomach bug with nausea and vomiting. He is feeling well. 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 He is responsible for about 15 facilities in [place] and [place].  He has had an initial COVID-vaccine but is not sure when he had it.  He is not feeling anxious or depressed.  He has been sexually  active with a few partners since his last visit but is not in a current relationship.  Review of Systems: Review of Systems  Constitutional: Negative for fever, malaise/fatigue and weight loss.  Psychiatric/Behavioral: Negative for depression.    Past Medical History:  Diagnosis Date  . HIV (human immunodeficiency virus infection) (HCC)     Social History   Tobacco Use  . Smoking status: Former Smoker    Packs/day: 0.25    Years: 9.00    Pack years: 2.25    Types: Cigarettes    Start date: 08/22/2005  . Smokeless tobacco: Never Used  . Tobacco comment: congratulated!!!  Substance Use Topics  . Alcohol use: Yes    Alcohol/week: 0.0 standard drinks    Comment: occ  . Drug use: Yes    Frequency: 7.0 times per week    Types: Marijuana    Family History  Problem Relation Age of Onset  . Hypertension Mother        and grandparents  . Diabetes Other        grandparents (deceased)    No Known Allergies  Health Maintenance  Topic Date Due  . Hepatitis C Screening  Never done  . TETANUS/TDAP  Never done  . INFLUENZA VACCINE  06/02/2020  . COVID-19 Vaccine (2 - Pfizer risk 4-dose series) 07/24/2020  . HIV Screening  Completed    Objective:  Vitals:   11/12/20 1533  BP: 122/72  Pulse: 86  Temp: 98.1 F (36.7 C)  TempSrc: Oral  SpO2: 97%  Weight: 183 lb (83 kg)  Height: 6\' 4"  (1.93 m)   Body mass index is 22.28 kg/m.  Physical Exam Constitutional:      Comments: He is in good spirits.  Cardiovascular:     Rate and Rhythm: Normal rate and regular rhythm.     Heart sounds: No murmur heard.   Pulmonary:     Effort: Pulmonary effort is normal.     Breath sounds: Normal breath sounds.  Psychiatric:        Mood and Affect: Mood normal.     Lab Results Lab Results  Component Value Date   WBC 4.4 02/03/2020   HGB 16.5 02/03/2020   HCT 46.1 02/03/2020   MCV 90.9 02/03/2020   PLT 266 02/03/2020    Lab Results  Component Value Date   CREATININE  0.87 02/03/2020   BUN 7 02/03/2020   NA 138 02/03/2020   K 4.1 02/03/2020   CL 104 02/03/2020   CO2 25 02/03/2020    Lab Results  Component Value Date   ALT 14 02/03/2020   AST 16 02/03/2020   ALKPHOS 47 02/03/2020   BILITOT 1.0 02/03/2020    Lab Results  Component Value Date   CHOL 141 09/20/2014   HDL 33 (L) 09/20/2014   LDLCALC 92 09/20/2014   TRIG 78 09/20/2014   CHOLHDL 4.3 09/20/2014   Lab Results  Component Value Date   LABRPR NON-REACTIVE 08/12/2020   RPRTITER 1:1 12/30/2015   HIV 1 RNA Quant  Date Value  08/12/2020 <20 Copies/mL  01/30/2020 5,790 copies/mL  03/24/2016 52 copies/mL (H)   CD4 T Cell Abs (/uL)  Date Value  08/12/2020 498  01/30/2020 203 (L)  03/24/2016 590     Problem List Items Addressed This Visit      High   HIV disease (HCC)    His infection is under excellent, long-term control.  He will continue Biktarvy and follow-up after lab work in 6 months.  He was instructed to get a COVID booster 6 months after his second COVID-vaccine.  He received his influenza vaccine here today.      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  It resolved promptly once his May was restarted.  His residual dizziness has resolved.        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  I encouraged him to limit the number of sexual partners he has, be very careful about who those partners are, having them tested and using condoms consistently.      Relevant Medications   ketoconazole (NIZORAL) 2 % cream    bictegravir-emtricitabine-tenofovir AF (BIKTARVY) 50-200-25 MG TABS tablet        Susanne Borders, MD [facility] [phone] pager   [phone] cell 11/12/2020, 4:00 PM

## 2020-11-12 NOTE — Assessment & Plan Note (Signed)
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  It resolved promptly once his Susanne Borders was restarted.  His residual dizziness has resolved.

## 2020-11-12 NOTE — Assessment & Plan Note (Signed)
His infection is under excellent, long-term control.  He will continue Biktarvy and follow-up after lab work in 6 months.  He was instructed to get a COVID booster 6 months after his second COVID-vaccine.  He received his influenza vaccine here today.

## 2020-11-12 NOTE — Assessment & Plan Note (Signed)
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  I encouraged him to limit the number of sexual partners he has, be very careful about who those partners are, having them tested and using condoms consistently.

## 2020-11-12 NOTE — Telephone Encounter (Signed)
[facility] Patient Advocate Encounter   Was successful in obtaining a [employer] copay card for $7200.  This copay card will make the patients copay 0.00.        Clearance Coots, CPhT Specialty Pharmacy Patient [facility] Phone: [phone] Fax:  [phone]

## 2021-03-26 ENCOUNTER — Other Ambulatory Visit (HOSPITAL_COMMUNITY): Payer: Self-pay

## 2021-03-26 ENCOUNTER — Encounter: Payer: Self-pay | Admitting: Internal Medicine

## 2021-05-13 ENCOUNTER — Ambulatory Visit: Payer: Self-pay | Admitting: Internal Medicine

## 2021-05-28 ENCOUNTER — Telehealth: Payer: Self-pay

## 2021-05-28 ENCOUNTER — Ambulatory Visit: Payer: Self-pay | Admitting: Internal Medicine

## 2021-05-28 NOTE — Telephone Encounter (Signed)
Left VM asking patient if he was able to make it to appointment this morning or to return my call if he needs to reschedule.     Siddharth Babington Lesli Albee, CMA

## 2021-08-20 ENCOUNTER — Telehealth: Payer: Self-pay

## 2021-08-20 NOTE — Telephone Encounter (Signed)
Called patient to get follow up scheduled, call cant be completed.

## 2021-10-04 ENCOUNTER — Encounter (HOSPITAL_BASED_OUTPATIENT_CLINIC_OR_DEPARTMENT_OTHER): Payer: Self-pay | Admitting: *Deleted

## 2021-10-04 ENCOUNTER — Emergency Department (HOSPITAL_BASED_OUTPATIENT_CLINIC_OR_DEPARTMENT_OTHER)
Admission: EM | Admit: 2021-10-04 | Discharge: 2021-10-04 | Disposition: A | Discharge status: Home / Self Care | Payer: Self-pay | Attending: Emergency Medicine | Admitting: Emergency Medicine

## 2021-10-04 ENCOUNTER — Other Ambulatory Visit: Payer: Self-pay

## 2021-10-04 DIAGNOSIS — R059 Cough, unspecified: Secondary | ICD-10-CM | POA: Insufficient documentation

## 2021-10-04 DIAGNOSIS — B081 Molluscum contagiosum: Secondary | ICD-10-CM | POA: Insufficient documentation

## 2021-10-04 DIAGNOSIS — Z87891 Personal history of nicotine dependence: Secondary | ICD-10-CM | POA: Insufficient documentation

## 2021-10-04 DIAGNOSIS — B360 Pityriasis versicolor: Secondary | ICD-10-CM | POA: Insufficient documentation

## 2021-10-04 DIAGNOSIS — R21 Rash and other nonspecific skin eruption: Secondary | ICD-10-CM

## 2021-10-04 DIAGNOSIS — Z21 Asymptomatic human immunodeficiency virus [HIV] infection status: Secondary | ICD-10-CM | POA: Insufficient documentation

## 2021-10-04 MED ORDER — FLUCONAZOLE 150 MG PO TABS: 300.0000 mg | ORAL_TABLET | ORAL | 0 refills | Status: AC

## 2021-10-04 MED ORDER — PODOFILOX 0.5 % EX SOLN: Freq: Two times a day (BID) | CUTANEOUS | 0 refills | Status: DC

## 2021-10-04 MED ORDER — HYDROXYZINE HCL 25 MG PO TABS: 25.0000 mg | ORAL_TABLET | Freq: Four times a day (QID) | ORAL | 0 refills | Status: DC | PRN

## 2021-10-04 NOTE — ED Provider Notes (Signed)
[facility] EMERGENCY DEPARTMENT Provider Note   CSN: [id number] Arrival date & time: 10/04/21  1932     History Chief Complaint  Patient presents with   Rash    Paul Palmer is a 35 y.o. male.  HPI     z  08/2020-cd4 498, viral load undectable, taking medication Has had rash on back for months tinea versicolor, comes and goes  New rash 3-4 days, small circular lesions over scrotum, penile lesions, larger lesion over neck, has one on arm scattered. Rash itches. Has had cough for one day. 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  Vaping Use   Vaping Use: Every day  Substance Use Topics   Alcohol use: Yes    Alcohol/week: 0.0 standard drinks    Comment: occ   Drug use: Yes    Frequency: 7.0 times per week    Types: Marijuana    Home Medications Prior to Admission medications   Medication Sig Start  Date End Date Taking? Authorizing Provider  bictegravir-emtricitabine-tenofovir AF (BIKTARVY) 50-200-25 MG TABS tablet Take 1 tablet by mouth daily. 11/12/20 11/07/21 Yes Cliffton Asters, MD  fluconazole (DIFLUCAN) 150 MG tablet Take 2 tablets (300 mg total) by mouth once a week for 14 days. 10/04/21 10/18/21 Yes Alvira Monday, MD  hydrOXYzine (ATARAX) 25 MG tablet Take 1-2 tablets (25-50 mg total) by mouth every 6 (six) hours as needed for itching. 10/04/21  Yes Alvira Monday, MD  podofilox (CONDYLOX) 0.5 % external solution Apply topically 2 (two) times daily. Twice daily for 3 consecutive days per week for up to 4 weeks. 10/04/21  Yes Alvira Monday, MD  ibuprofen (ADVIL) 800 MG tablet Take 800 mg by mouth daily as needed for fever.  Patient not taking: Reported on 11/12/2020    [provider]    Allergies    Patient has no known allergies.  Review of Systems   Review of Systems  Constitutional:  Negative for fatigue and fever.  HENT:  Negative for congestion and sore throat.   Respiratory:  Positive for cough.   Gastrointestinal:  Negative for abdominal pain, diarrhea, nausea and rectal pain.  Genitourinary:  Negative for dysuria.  Skin:  Positive for rash.  Neurological:  Negative for headaches.   Physical Exam Updated  Vital Signs BP 140/84   Pulse 71   Temp 98.3 F (36.8 C) (Oral)   Resp 17   Ht 6\' 4"  (1.93 m)   Wt 80.3 kg   SpO2 100%   BMI 21.55 kg/m   Physical Exam Vitals and nursing note reviewed.  Constitutional:      General: He is not in acute distress.    Appearance: Normal appearance. He is not ill-appearing, toxic-appearing or diaphoretic.  HENT:     Head: Normocephalic.  Eyes:     Conjunctiva/sclera: Conjunctivae normal.  Cardiovascular:     Rate and Rhythm: Normal rate and regular rhythm.     Pulses: Normal pulses.  Pulmonary:     Effort: Pulmonary effort is normal. No respiratory distress.  Musculoskeletal:        General: No deformity or  signs of injury.     Cervical back: No rigidity.  Skin:    General: Skin is warm and dry.     Coloration: Skin is not jaundiced or pale.     Comments: Pearly skin colored papules over penis, scrotum.   Large areas of circular hypopigmented patches to back Other more nodular area to side of neck (see photo)   Neurological:     General: No focal deficit present.     Mental Status: He is alert and oriented to person, place, and time.       ED Results / Procedures / Treatments   Labs (all labs ordered are listed, but only abnormal results are displayed) Labs Reviewed  MONKEYPOX VIRUS DNA, QUALITATIVE REAL-TIME PCR    EKG None  Radiology No results found.  Procedures Procedures   Medications Ordered in ED Medications - No data to display  ED Course  I have reviewed the triage vital signs and the nursing notes.  Pertinent labs & imaging results that were available during my care of the patient were reviewed by me and considered in my medical decision making (see chart for details).    MDM Rules/Calculators/A&P                           35yo male with history of HIV presents with concern for rash. Has a few different types of rashes on exam. They do not have the appearance of SSS, TEN, erythroderma, scabies, RMSF or hives.  Rash on the back is consistent with tinea versicolor. Genital rash most consistent with molluscum.  Has one tiny near vesicle over arm, consider Mpox but does not have any lesions appropriate for swabbing and recommend clinical follow up to see if rash is changing. Overall suspicion for Mpox at this time is low.  Has more nodulear type ares. Not consistent with abscess. Given medication for molluscum and tinea versicolor and recommend ID follow up. Patient discharged in stable condition with understanding of reasons to return.    Final Clinical Impression(s) / ED Diagnoses Final diagnoses:  Molluscum contagiosum  Tinea versicolor  Rash    Rx / DC  Orders ED Discharge Orders          Ordered    podofilox (CONDYLOX) 0.5 % external solution  2 times daily        10/04/21 2129    fluconazole (DIFLUCAN) 150 MG tablet  Weekly        10/04/21 2129    hydrOXYzine (ATARAX) 25 MG tablet  Every 6 hours PRN        10/04/21 2134  Alvira Monday, MD 10/05/21 1144

## 2021-10-04 NOTE — ED Provider Notes (Incomplete)
[facility] EMERGENCY DEPARTMENT Provider Note   CSN: [id number] Arrival date & time: 10/04/21  1932     History Chief Complaint  Patient presents with   Rash    Paul Palmer is a 35 y.o. male.  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  Vaping Use   Vaping Use: Every day  Substance Use Topics   Alcohol use: Yes    Alcohol/week: 0.0 standard drinks    Comment: occ   Drug use: Yes    Frequency: 7.0 times per week    Types: Marijuana    Home Medications Prior to Admission medications   Medication Sig Start Date End Date Taking? Authorizing Provider  bictegravir-emtricitabine-tenofovir AF (BIKTARVY) 50-200-25 MG TABS tablet Take 1 tablet by mouth daily. 11/12/20 11/07/21 Yes Cliffton Asters, MD  fluconazole (DIFLUCAN) 150 MG tablet Take 2 tablets (300 mg total) by mouth once a week. Patient not taking: No sig reported 01/31/20   Elige Radon, MD  ibuprofen (ADVIL) 800 MG tablet Take 800 mg by mouth daily as  needed for fever.  Patient not taking: Reported on 11/12/2020    [provider]    Allergies    Patient has no known allergies.  Review of Systems   Review of Systems  Physical Exam Updated Vital Signs BP (!) 142/93 (BP Location: Right Arm)    Pulse 76    Temp 98.3 F (36.8 C) (Oral)    Resp 16    Ht 6\' 4"  (1.93 m)    Wt 80.3 kg    SpO2 100%    BMI 21.55 kg/m   Physical Exam  ED Results / Procedures / Treatments   Labs (all labs ordered are listed, but only abnormal results are displayed) Labs Reviewed  MONKEYPOX VIRUS DNA, QUALITATIVE REAL-TIME PCR    EKG None  Radiology No results found.  Procedures Procedures   Medications Ordered in ED Medications - No data to display  ED Course  I have reviewed the triage vital signs and the nursing notes.  Pertinent labs & imaging results that were available during my care of the patient were reviewed by me and considered in my medical decision making (see chart for details).    MDM Rules/Calculators/A&P                         {  Remember to document critical care time when appropriate:1}  *** Final Clinical Impression(s) / ED Diagnoses Final diagnoses:  None    Rx / DC Orders ED Discharge Orders     None

## 2021-10-04 NOTE — ED Triage Notes (Addendum)
Pt reports rash x 3-4 days. He has hives to most of his back as well as scattered circular lesions. States rash is also on his genitals. HIV +. Denies pain unless lesions are touched. C/O itching

## 2021-10-04 NOTE — Discharge Instructions (Signed)
You appear to have at least 3 different type of rash. One is consistent with your prior tinea versicolor, one appears most consistent with molluscum contagiosum, and you have other lesions which are more difficult to define and recommend close follow up with ID, possible dermatology referral for further evaluation. At this time, I do not see a lesion to swab for mpox but recommend continuing to monitor your symptoms and rash and if you develop lesions more consistent with blisters you can discuss with either ID or return.

## 2021-10-07 ENCOUNTER — Ambulatory Visit (INDEPENDENT_AMBULATORY_CARE_PROVIDER_SITE_OTHER): Payer: Self-pay | Admitting: Internal Medicine

## 2021-10-07 ENCOUNTER — Other Ambulatory Visit: Payer: Self-pay | Admitting: Pharmacist

## 2021-10-07 ENCOUNTER — Other Ambulatory Visit: Payer: Self-pay

## 2021-10-07 ENCOUNTER — Encounter: Payer: Self-pay | Admitting: Internal Medicine

## 2021-10-07 ENCOUNTER — Ambulatory Visit: Payer: Self-pay

## 2021-10-07 DIAGNOSIS — B360 Pityriasis versicolor: Secondary | ICD-10-CM

## 2021-10-07 DIAGNOSIS — N509 Disorder of male genital organs, unspecified: Secondary | ICD-10-CM

## 2021-10-07 DIAGNOSIS — B20 Human immunodeficiency virus [HIV] disease: Secondary | ICD-10-CM

## 2021-10-07 MED ORDER — FLUCONAZOLE 100 MG PO TABS: 100.0000 mg | ORAL_TABLET | Freq: Every day | ORAL | 0 refills | Status: DC

## 2021-10-07 MED ORDER — BIKTARVY 50-200-25 MG PO TABS: 1.0000 | ORAL_TABLET | Freq: Every day | ORAL | 0 refills | Status: AC

## 2021-10-07 NOTE — Assessment & Plan Note (Signed)
I doubt that his various skin lesions are all due to the same thing.  Lesion on his right forearm and the nodular lesion on his neck may have started as folliculitis.  The circular lesions on his back and in his groin that look more like plaque-like tinea corporis.  I will treat him with oral fluconazole and see him back in 2 weeks.

## 2021-10-07 NOTE — Assessment & Plan Note (Signed)
I doubt that his enlarging scrotal lesion has anything to do with his skin lesions.  I will consider a scrotal ultrasound in the near future.

## 2021-10-07 NOTE — Progress Notes (Signed)
Medication Samples have been provided to the patient.  Drug name: Biktarvy        Strength: 50/200/25 mg       Qty: 4 bottles (28 tablets)   LOT: [id number]   Exp.Date: 08/03/2023  Dosing instructions: Take one tablet by mouth once daily  The patient has been instructed regarding the correct time, dose, and frequency of taking this medication, including desired effects and most common side effects.   Gearald Stonebraker L. Jannette Fogo, PharmD, BCIDP, AAHIVP, CPP Clinical Pharmacist Practitioner Infectious Diseases Clinical Pharmacist [facility] 10/14/2020, 10:07 AM

## 2021-10-07 NOTE — Assessment & Plan Note (Signed)
He will get repeat blood work today.  We have given him an emergency supply of Biktarvy and instructed him to restart it right away.  He will follow-up in 2 weeks.

## 2021-10-07 NOTE — Progress Notes (Signed)
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   FLUCONAZOLE (DIFLUCAN) 100 MG TABLET    Take 1 tablet (100 mg total) by mouth daily.  Previous Medications   BICTEGRAVIR-EMTRICITABINE-TENOFOVIR AF (BIKTARVY) 50-200-25 MG TABS TABLET    Take 1 tablet by mouth daily.   FLUCONAZOLE (DIFLUCAN) 150 MG TABLET    Take 2 tablets (300 mg total) by mouth once a week for 14 days.   HYDROXYZINE (ATARAX) 25 MG TABLET    Take 1-2 tablets (25-50 mg total) by mouth every 6 (six) hours as needed for itching.   IBUPROFEN (ADVIL) 800 MG TABLET    Take 800 mg by mouth daily as needed for fever.    PODOFILOX (CONDYLOX) 0.5 % EXTERNAL SOLUTION    Apply topically 2 (two) times daily. Twice daily for 3 consecutive days per week for up to 4 weeks.  Modified Medications   No medications on file  Discontinued Medications   No medications on file    Subjective: Paul Palmer is in for a ED follow-up visit.  He says that he recently changed jobs and was afraid he was going to run out of his [employer].  He stopped taking it 2 weeks ago.  He only has about 5 pills left.  He met with our financial counselor today before his visit with me.  He has also been concerned about some new skin lesions.  He has had a  chronic tinea versicolor but recently noted a new lesion on his right forearm, left neck and a variety of circular lesions most noticeable on his back and in his groin.  He has been in a stable, monogamous relationship for 5 months.  His partner has not had any type of rash.  Kionte has also noted some recent swelling around his left testicle.  There has been a lesion there for many years but it recently has gotten bigger.  It is not tender.  He has not had any recent fever, chills or sweats.  He has not had any dysuria or rectal pain.  Review of Systems: Review of Systems  Constitutional:  Negative for chills, diaphoresis, fever and malaise/fatigue.  Gastrointestinal:  Negative for abdominal pain, diarrhea, nausea and vomiting.  Genitourinary:  Negative for dysuria.  Skin:  Positive for itching and rash.   Past Medical History:  Diagnosis Date   HIV (human immunodeficiency virus infection) ([place])     Social History   Tobacco Use   Smoking status: Former    Packs/day: 0.25    Years: 9.00    Pack years: 2.25    Types: Cigarettes    Start date: 08/22/2005   Smokeless tobacco: Never   Tobacco comments:  congratulated!!!  Scientific laboratory technician Use: Every day  Substance Use Topics   Alcohol use: Yes    Alcohol/week: 0.0 standard drinks    Comment: occ   Drug use: Yes    Frequency: 7.0 times per week    Types: Marijuana    Family History  Problem Relation Age of Onset   Hypertension Mother        and grandparents   Diabetes Other        grandparents (deceased)    No Known Allergies  Health Maintenance  Topic Date Due   Hepatitis C Screening  Never done   TETANUS/TDAP  Never done   Pneumococcal Vaccine 85-79 Years old (2 - PCV) 12/23/2012   COVID-19 Vaccine (3 - Pfizer risk series) 08/21/2020   INFLUENZA VACCINE  06/02/2021   HIV Screening  Completed   HPV VACCINES  Aged Out    Objective:  Vitals:   10/07/21 1604  BP: (!) 151/88  Pulse: 98  Temp: 98.5 F (36.9  C)  TempSrc: Oral  SpO2: 97%  Weight: 179 lb (81.2 kg)   Body mass index is 21.79 kg/m.  Physical Exam Constitutional:      Comments: He has appropriately concerned about his skin lesions but otherwise in good spirits.  HENT:     Mouth/Throat:     Mouth: Mucous membranes are moist.     Pharynx: Oropharynx is clear. No oropharyngeal exudate.  Eyes:     Conjunctiva/sclera: Conjunctivae normal.  Cardiovascular:     Rate and Rhythm: Normal rate.  Pulmonary:     Effort: Pulmonary effort is normal.  Skin:    Findings: Rash present. No erythema.     Comments: He has chronic hypopigmented lesions on his back compatible with chronic tinea infection.  He has a 10 mm raised lesion on his right forearm with some superficial scale.  He has a raised, dime sized nodule on his left neck.  He has multiple circular lesions with a raised border that are most noticeable on his back and in his groin.  He has a soft fluctuant lesion around his left testicle.  It is nontender.  Psychiatric:        Mood and Affect: Mood normal.   Circular lesion on his back  Nodular lesion on his neck  Lab Results Lab Results  Component Value Date   WBC 4.4 02/03/2020   HGB 16.5 02/03/2020   HCT 46.1 02/03/2020   MCV 90.9 02/03/2020   PLT 266 02/03/2020    Lab Results  Component Value Date   CREATININE 0.87 02/03/2020   BUN 7 02/03/2020   NA 138 02/03/2020   K 4.1 02/03/2020   CL 104 02/03/2020   CO2 25 02/03/2020    Lab Results  Component Value Date   ALT 14 02/03/2020   AST 16 02/03/2020   ALKPHOS 47 02/03/2020   BILITOT 1.0 02/03/2020    Lab Results  Component Value Date   CHOL 141 09/20/2014   HDL 33 (L) 09/20/2014   LDLCALC 92 09/20/2014   TRIG 78 09/20/2014   CHOLHDL 4.3 09/20/2014   Lab Results  Component Value Date   LABRPR NON-REACTIVE 08/12/2020   RPRTITER 1:1 12/30/2015   HIV 1 RNA Quant  Date Value  08/12/2020 <20 Copies/mL  01/30/2020 5,790 copies/mL  03/24/2016 52  copies/mL (H)   CD4 T Cell Abs (/uL)  Date Value  08/12/2020 498  01/30/2020 203 (L)  03/24/2016 590     Problem  List Items Addressed This Visit       High   HIV disease [facility])    He will get repeat blood work today.  We have given him an emergency supply of Biktarvy and instructed him to restart it right away.  He will follow-up in 2 weeks.      Relevant Medications   fluconazole (DIFLUCAN) 100 MG tablet   Other Relevant Orders   T-helper cell (CD4)- ([facility] clinic only)   HIV-1 RNA quant-no reflex-bld   CBC   Comprehensive metabolic panel   RPR   Lipid panel   Urine cytology ancillary only     Unprioritized   Tinea versicolor    I doubt that his various skin lesions are all due to the same thing.  Lesion on his right forearm and the nodular lesion on his neck may have started as folliculitis.  The circular lesions on his back and in his groin that look more like plaque-like tinea corporis.  I will treat him with oral fluconazole and see him back in 2 weeks.      Relevant Medications   fluconazole (DIFLUCAN) 100 MG tablet   Scrotal lesion    I doubt that his enlarging scrotal lesion has anything to do with his skin lesions.  I will consider a scrotal ultrasound in the near future.         Michel Bickers, MD [facility] [place] [facility] [phone] pager   [phone] cell 10/07/2021, 4:40 PM

## 2021-10-08 LAB — T-HELPER CELL (CD4) - (RCID CLINIC ONLY)
CD4 % Helper T Cell: 25 % — ABNORMAL LOW (ref 33–65)
CD4 T Cell Abs: 437 /uL (ref 400–1790)

## 2021-10-09 ENCOUNTER — Ambulatory Visit (INDEPENDENT_AMBULATORY_CARE_PROVIDER_SITE_OTHER): Payer: Self-pay

## 2021-10-09 ENCOUNTER — Telehealth: Payer: Self-pay

## 2021-10-09 ENCOUNTER — Other Ambulatory Visit: Payer: Self-pay

## 2021-10-09 DIAGNOSIS — A539 Syphilis, unspecified: Secondary | ICD-10-CM

## 2021-10-09 LAB — URINE CYTOLOGY ANCILLARY ONLY
Chlamydia: NEGATIVE
Comment: NEGATIVE
Comment: NORMAL
Neisseria Gonorrhea: NEGATIVE

## 2021-10-09 MED ORDER — PENICILLIN G BENZATHINE 1200000 UNIT/2ML IM SUSY
1.2000 10*6.[IU] | PREFILLED_SYRINGE | Freq: Once | INTRAMUSCULAR | Status: AC
  Administered 2021-10-09: 1.2 10*6.[IU] via INTRAMUSCULAR

## 2021-10-09 NOTE — Telephone Encounter (Signed)
Spoke with patient, relayed positive syphilis results. Patient scheduled for Bicillin x 3. Advised patient no sex until treatment is completed plus an additional 10 days and instructed to notify sexual partners for testing and treatment. Patient verbalized understanding and has no further questions.    Sandie Ano, RN

## 2021-10-09 NOTE — Telephone Encounter (Signed)
-----   Message from Cliffton Asters, MD sent at 10/09/2021 10:42 AM EST ----- Paul Palmer has syphilis and needs to be called in for Benzathine Pcn 2.4 million units IM weekly times 3.

## 2021-10-09 NOTE — Progress Notes (Signed)
Reviewed and verified allergies with patient. Patient tolerated Bicillin injections well. Reinforced abstinence until treatment completed plus an additional 10 days, offered condoms and encouraged use. Advised patient to notify sexual partners for testing and treatment. Patient verbalized understanding.   Joshwa Hemric D Ephraim Reichel, RN  

## 2021-10-10 LAB — LIPID PANEL
Cholesterol: 153 mg/dL (ref <200)
HDL: 33 mg/dL — ABNORMAL LOW (ref >=40)
LDL Cholesterol (Calc): 92 mg/dL (calc)
Non-HDL Cholesterol (Calc): 120 mg/dL (calc) (ref <130)
Total CHOL/HDL Ratio: 4.6 (calc) (ref <5.0)
Triglycerides: 188 mg/dL — ABNORMAL HIGH (ref <150)

## 2021-10-10 LAB — COMPREHENSIVE METABOLIC PANEL
AG Ratio: 1.1 (calc) (ref 1.0–2.5)
ALT: 15 U/L (ref 9–46)
AST: 18 U/L (ref 10–40)
Albumin: 4 g/dL (ref 3.6–5.1)
Alkaline phosphatase (APISO): 65 U/L (ref 36–130)
BUN: 10 mg/dL (ref 7–25)
CO2: 26 mmol/L (ref 20–32)
Calcium: 9.1 mg/dL (ref 8.6–10.3)
Chloride: 104 mmol/L (ref 98–110)
Creat: 0.87 mg/dL (ref 0.60–1.26)
Globulin: 3.5 g/dL (calc) (ref 1.9–3.7)
Glucose, Bld: 108 mg/dL — ABNORMAL HIGH (ref 65–99)
Potassium: 4.1 mmol/L (ref 3.5–5.3)
Sodium: 138 mmol/L (ref 135–146)
Total Bilirubin: 0.5 mg/dL (ref 0.2–1.2)
Total Protein: 7.5 g/dL (ref 6.1–8.1)

## 2021-10-10 LAB — CBC
HCT: 45.2 % (ref 38.5–50.0)
Hemoglobin: 15.4 g/dL (ref 13.2–17.1)
MCH: 31.2 pg (ref 27.0–33.0)
MCHC: 34.1 g/dL (ref 32.0–36.0)
MCV: 91.5 fL (ref 80.0–100.0)
MPV: 9.5 fL (ref 7.5–12.5)
Platelets: 348 10*3/uL (ref 140–400)
RBC: 4.94 10*6/uL (ref 4.20–5.80)
RDW: 12.5 % (ref 11.0–15.0)
WBC: 5.6 10*3/uL (ref 3.8–10.8)

## 2021-10-10 LAB — HIV-1 RNA QUANT-NO REFLEX-BLD
HIV 1 RNA Quant: 16200 Copies/mL — ABNORMAL HIGH
HIV-1 RNA Quant, Log: 4.21 Log cps/mL — ABNORMAL HIGH

## 2021-10-10 LAB — RPR TITER: RPR Titer: 1:32 {titer} — ABNORMAL HIGH

## 2021-10-10 LAB — FLUORESCENT TREPONEMAL AB(FTA)-IGG-BLD: Fluorescent Treponemal ABS: REACTIVE — AB

## 2021-10-10 LAB — RPR: RPR Ser Ql: REACTIVE — AB

## 2021-10-16 ENCOUNTER — Other Ambulatory Visit: Payer: Self-pay

## 2021-10-16 ENCOUNTER — Ambulatory Visit (INDEPENDENT_AMBULATORY_CARE_PROVIDER_SITE_OTHER): Payer: Self-pay

## 2021-10-16 DIAGNOSIS — A539 Syphilis, unspecified: Secondary | ICD-10-CM

## 2021-10-16 MED ORDER — PENICILLIN G BENZATHINE 1200000 UNIT/2ML IM SUSY
1.2000 10*6.[IU] | PREFILLED_SYRINGE | Freq: Once | INTRAMUSCULAR | Status: AC
Start: 2021-10-16 — End: 2021-10-16
  Administered 2021-10-16: 1.2 10*6.[IU] via INTRAMUSCULAR

## 2021-10-16 MED ORDER — PENICILLIN G BENZATHINE 1200000 UNIT/2ML IM SUSY
1.2000 10*6.[IU] | PREFILLED_SYRINGE | Freq: Once | INTRAMUSCULAR | Status: AC
  Administered 2021-10-16: 1.2 10*6.[IU] via INTRAMUSCULAR

## 2021-10-22 ENCOUNTER — Ambulatory Visit (INDEPENDENT_AMBULATORY_CARE_PROVIDER_SITE_OTHER): Payer: Self-pay | Admitting: Internal Medicine

## 2021-10-22 ENCOUNTER — Other Ambulatory Visit: Payer: Self-pay

## 2021-10-22 ENCOUNTER — Encounter: Payer: Self-pay | Admitting: Internal Medicine

## 2021-10-22 DIAGNOSIS — B20 Human immunodeficiency virus [HIV] disease: Secondary | ICD-10-CM

## 2021-10-22 DIAGNOSIS — A5149 Other secondary syphilitic conditions: Secondary | ICD-10-CM

## 2021-10-22 DIAGNOSIS — B360 Pityriasis versicolor: Secondary | ICD-10-CM

## 2021-10-22 MED ORDER — PENICILLIN G BENZATHINE 1200000 UNIT/2ML IM SUSY
1.2000 10*6.[IU] | PREFILLED_SYRINGE | Freq: Once | INTRAMUSCULAR | Status: AC
  Administered 2021-10-22: 11:00:00 1.2 10*6.[IU] via INTRAMUSCULAR

## 2021-10-22 MED ORDER — PENICILLIN G BENZATHINE 1200000 UNIT/2ML IM SUSY
1.2000 10*6.[IU] | PREFILLED_SYRINGE | Freq: Once | INTRAMUSCULAR | Status: AC
Start: 2021-10-22 — End: 2021-10-22
  Administered 2021-10-22: 11:00:00 1.2 10*6.[IU] via INTRAMUSCULAR

## 2021-10-22 NOTE — Progress Notes (Signed)
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   BICTEGRAVIR-EMTRICITABINE-TENOFOVIR AF (BIKTARVY) 50-200-25 MG TABS TABLET    Take 1 tablet by mouth daily for 28 days.   FLUCONAZOLE (DIFLUCAN) 100 MG TABLET    Take 1 tablet (100 mg total) by mouth daily.   HYDROXYZINE (ATARAX) 25 MG TABLET    Take 1-2 tablets (25-50 mg total) by mouth every 6 (six) hours as needed for itching.   IBUPROFEN (ADVIL) 800 MG TABLET    Take 800 mg by mouth daily as needed for fever.    PODOFILOX (CONDYLOX) 0.5 % EXTERNAL SOLUTION    Apply topically 2 (two) times daily. Twice daily for 3 consecutive days per week for up to 4 weeks.  Modified Medications   No medications on file  Discontinued Medications   No medications on file    Subjective: Paul Palmer is in for his routine HIV follow-up visit.  He was in several weeks ago with a new disseminated rash.  He had stopped taking his Biktarvy several weeks before that visit.  Blood work revealed that his viral load had reactivated but his CD4 count was still within the normal range.  His RPR was positive at a titer of 1:32.  It had been nonreactive 14 months earlier.  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  He  restarted his Biktarvy.  He has not missed any doses.  He still has splotchy areas of hypopigmentation from his chronic tinea rash but his generalized maculopapular rash started resolving promptly after his first dose of penicillin.  Review of Systems: Review of Systems  Constitutional:  Negative for fever.  Skin:  Positive for rash. Negative for itching.  Psychiatric/Behavioral:  Negative for depression.    Past Medical History:  Diagnosis Date   HIV (human immunodeficiency virus infection) ([facility])     Social History   Tobacco Use   Smoking status: Former    Packs/day: 0.25    Years: 9.00    Pack years: 2.25    Types: Cigarettes    Start date: 08/22/2005   Smokeless tobacco: Never   Tobacco comments:    congratulated!!!  Vaping Use   Vaping Use: Every day  Substance Use Topics   Alcohol use: Yes    Alcohol/week: 0.0 standard drinks    Comment: occ   Drug use: Yes    Frequency: 7.0 times per week    Types: Marijuana    Family History  Problem Relation Age of Onset   Hypertension Mother        and grandparents   Diabetes Other        grandparents (deceased)    No  Known Allergies  Health Maintenance  Topic Date Due   Hepatitis C Screening  Never done   TETANUS/TDAP  Never done   Pneumococcal Vaccine 19-31 Years old (2 - PCV) 12/23/2012   COVID-19 Vaccine (3 - Pfizer risk series) 08/21/2020   INFLUENZA VACCINE  06/02/2021   HIV Screening  Completed   HPV VACCINES  Aged Out    Objective:  Vitals:   10/22/21 0845  BP: (!) 146/97  Pulse: 71  Temp: 98 F (36.7 C)  TempSrc: Oral  Weight: 178 lb (80.7 kg)   Body mass index is 21.67 kg/m.  Physical Exam Constitutional:      Comments: He looks like he is feeling much better.  He is very pleasant as usual.  Cardiovascular:     Rate and Rhythm: Normal rate.  Pulmonary:     Effort: Pulmonary effort is normal.  Skin:    Comments: He has splotchy hypopigmented areas of chronic tinea that are largely  unchanged.  His slightly hyperpigmented maculopapular rash is going away.  Psychiatric:        Mood and Affect: Mood normal.    Lab Results Lab Results  Component Value Date   WBC 5.6 10/07/2021   HGB 15.4 10/07/2021   HCT 45.2 10/07/2021   MCV 91.5 10/07/2021   PLT 348 10/07/2021    Lab Results  Component Value Date   CREATININE 0.87 10/07/2021   BUN 10 10/07/2021   NA 138 10/07/2021   K 4.1 10/07/2021   CL 104 10/07/2021   CO2 26 10/07/2021    Lab Results  Component Value Date   ALT 15 10/07/2021   AST 18 10/07/2021   ALKPHOS 47 02/03/2020   BILITOT 0.5 10/07/2021    Lab Results  Component Value Date   CHOL 153 10/07/2021   HDL 33 (L) 10/07/2021   LDLCALC 92 10/07/2021   TRIG 188 (H) 10/07/2021   CHOLHDL 4.6 10/07/2021   Lab Results  Component Value Date   LABRPR REACTIVE (A) 10/07/2021   RPRTITER 1:32 (H) 10/07/2021   HIV 1 RNA Quant  Date Value  10/07/2021 16,200 Copies/mL (H)  08/12/2020 <20 Copies/mL  01/30/2020 5,790 copies/mL   CD4 T Cell Abs (/uL)  Date Value  10/07/2021 437  08/12/2020 498  01/30/2020 203 (L)     Problem List Items Addressed This Visit       High   HIV disease ([facility])    He is back on Biktarvy and not missing doses.  He will follow-up for repeat blood work in 6 weeks.        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  His long-term partner is here for testing today.        Unprioritized   Tinea versicolor    His generalized itching has resolved with treatment with fluconazole.         Cliffton Asters, MD [facility] for Infectious [facility] [phone] pager   [phone] cell 10/22/2021, 9:03 AM

## 2021-10-22 NOTE — Assessment & Plan Note (Signed)
His generalized itching has resolved with treatment with fluconazole.

## 2021-10-22 NOTE — Addendum Note (Signed)
Addended by: Tressa Busman T on: 10/22/2021 10:53 AM   Modules accepted: Orders

## 2021-10-22 NOTE — Assessment & Plan Note (Addendum)
His latent syphilis is responding to penicillin therapy.  His long-term partner is here for testing today.

## 2021-10-22 NOTE — Assessment & Plan Note (Signed)
He is back on Biktarvy and not missing doses.  He will follow-up for repeat blood work in 6 weeks.

## 2021-12-02 ENCOUNTER — Other Ambulatory Visit: Payer: Self-pay | Admitting: Internal Medicine

## 2021-12-02 DIAGNOSIS — B20 Human immunodeficiency virus [HIV] disease: Secondary | ICD-10-CM

## 2021-12-02 MED ORDER — BICTEGRAVIR-EMTRICITAB-TENOFOV 50-200-25 MG PO TABS
1.0000 | ORAL_TABLET | Freq: Every day | ORAL | 11 refills | Status: DC
Start: 2021-12-02 — End: 2021-12-08

## 2021-12-03 ENCOUNTER — Ambulatory Visit: Payer: Self-pay | Admitting: Internal Medicine

## 2021-12-08 ENCOUNTER — Other Ambulatory Visit: Payer: Self-pay | Admitting: Internal Medicine

## 2021-12-08 DIAGNOSIS — B20 Human immunodeficiency virus [HIV] disease: Secondary | ICD-10-CM

## 2021-12-08 NOTE — Telephone Encounter (Signed)
Appt 2/9

## 2021-12-11 ENCOUNTER — Other Ambulatory Visit: Payer: Self-pay

## 2021-12-11 ENCOUNTER — Ambulatory Visit (INDEPENDENT_AMBULATORY_CARE_PROVIDER_SITE_OTHER): Payer: Self-pay | Admitting: Internal Medicine

## 2021-12-11 ENCOUNTER — Encounter: Payer: Self-pay | Admitting: Internal Medicine

## 2021-12-11 DIAGNOSIS — A5149 Other secondary syphilitic conditions: Secondary | ICD-10-CM

## 2021-12-11 DIAGNOSIS — B360 Pityriasis versicolor: Secondary | ICD-10-CM

## 2021-12-11 DIAGNOSIS — B20 Human immunodeficiency virus [HIV] disease: Secondary | ICD-10-CM

## 2021-12-11 NOTE — Assessment & Plan Note (Signed)
I hope and suspect that his viral load has suppressed again since restarting Biktarvy 2 months ago.  He will get blood work today, continue Radio producer and follow-up in 6 months.

## 2021-12-11 NOTE — Assessment & Plan Note (Signed)
We will repeat his RPR today. 

## 2021-12-11 NOTE — Assessment & Plan Note (Signed)
His tinea has cleared following recent retreatment.

## 2021-12-11 NOTE — Progress Notes (Signed)
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   HYDROXYZINE (ATARAX) 25 MG TABLET    Take 1-2 tablets (25-50 mg total) by mouth every 6 (six) hours as needed for itching.   IBUPROFEN (ADVIL) 800 MG TABLET    Take 800 mg by mouth daily as needed for fever.    PODOFILOX (CONDYLOX) 0.5 % EXTERNAL SOLUTION    Apply topically 2 (two) times daily. Twice daily for 3 consecutive days per week for up to 4 weeks.  Modified Medications   No medications on file  Discontinued Medications   No medications on file    Subjective: Paul Palmer is in for his routine HIV follow-up visit.  He did miss 2 days of his Biktarvy recently when he did not realize he had to renew his ADAP again.  He had just renewed it in December.  Otherwise he has not missed any doses since she restarted Biktarvy in December.  His rashes have resolved completely after recent treatment for latent syphilis and retreatment for tinea infection.  Review of Systems: Review of Systems  Constitutional:  Negative for fever and weight loss.  Skin:  Negative for itching and rash.   Past Medical History:  Diagnosis Date   HIV (human  immunodeficiency virus infection) ([place])     Social History   Tobacco Use   Smoking status: Former    Packs/day: 0.25    Years: 9.00    Pack years: 2.25    Types: Cigarettes, E-cigarettes    Start date: 08/22/2005   Smokeless tobacco: Never   Tobacco comments:    Patient vapes, encouraged cessation   Vaping Use   Vaping Use: Every day  Substance Use Topics   Alcohol use: Yes    Alcohol/week: 0.0 standard drinks    Comment: occ   Drug use: Yes    Frequency: 7.0 times per week    Types: Marijuana    Comment: daily    Family History  Problem Relation Age of Onset   Hypertension Mother        and grandparents   Diabetes Other        grandparents (deceased)    No Known Allergies  Health Maintenance  Topic Date Due   Hepatitis C Screening  Never done   TETANUS/TDAP  Never done   COVID-19 Vaccine (3 - Pfizer risk series) 08/21/2020   INFLUENZA VACCINE  06/02/2021   HIV Screening  Completed   Pneumococcal Vaccine 59-69 Years old  Aged Out   HPV VACCINES  Aged Out    Objective:  Vitals:   12/11/21 1017  BP: 119/85  Pulse: 68  Temp: 97.8 F (36.6 C)  TempSrc: Oral  SpO2: 98%  Weight: 175 lb (79.4 kg)  Height: 6\' 4"  (1.93 m)   Body mass index is 21.3 kg/m.  Physical Exam Constitutional:      Comments: He is calm and in good spirits.  His hair is newly dyed green.  Cardiovascular:     Rate and Rhythm: Normal rate.  Pulmonary:     Effort: Pulmonary effort is normal.  Skin:    Findings: No erythema or rash.  Psychiatric:        Mood and Affect: Mood normal.    Lab Results Lab Results  Component Value Date   WBC 5.6 10/07/2021   HGB 15.4 10/07/2021   HCT 45.2 10/07/2021   MCV 91.5 10/07/2021   PLT 348 10/07/2021    Lab Results  Component Value Date   CREATININE 0.87 10/07/2021   BUN 10 10/07/2021   NA 138 10/07/2021   K 4.1 10/07/2021   CL 104 10/07/2021   CO2 26 10/07/2021    Lab Results  Component Value Date   ALT 15 10/07/2021   AST  18 10/07/2021   ALKPHOS 47 02/03/2020   BILITOT 0.5 10/07/2021    Lab Results  Component Value Date   CHOL 153 10/07/2021   HDL 33 (L) 10/07/2021   LDLCALC 92 10/07/2021   TRIG 188 (H) 10/07/2021   CHOLHDL 4.6 10/07/2021   Lab Results  Component Value Date   LABRPR REACTIVE (A) 10/07/2021   RPRTITER 1:32 (H) 10/07/2021   HIV 1 RNA Quant  Date Value  10/07/2021 16,200 Copies/mL (H)  08/12/2020 <20 Copies/mL  01/30/2020 5,790 copies/mL   CD4 T Cell Abs (/uL)  Date Value  10/07/2021 437  08/12/2020 498  01/30/2020 203 (L)     Problem List Items Addressed This Visit       High   HIV disease ([place])    I hope and suspect that his viral load has suppressed again since restarting Biktarvy 2 months ago.  He will get blood work today, continue Airline pilot and follow-up in 6 months.      Relevant Orders   T-helper cells (CD4) count (not at [facility])   HIV-1 RNA quant-no reflex-bld   RPR     Medium    Secondary syphilis in male    We will repeat his RPR today.        Unprioritized   Tinea versicolor    His tinea has cleared following recent retreatment.         Michel Bickers, MD [facility] for [facility] [place] [facility] [phone] pager   [phone] cell 12/11/2021, 10:35 AM

## 2021-12-15 LAB — FLUORESCENT TREPONEMAL AB(FTA)-IGG-BLD: Fluorescent Treponemal ABS: REACTIVE — AB

## 2021-12-15 LAB — T-HELPER CELLS (CD4) COUNT (NOT AT ARMC)
Absolute CD4: 375 cells/uL — ABNORMAL LOW (ref 490–1740)
CD4 T Helper %: 29 % — ABNORMAL LOW (ref 30–61)
Total lymphocyte count: 1282 cells/uL (ref 850–3900)

## 2021-12-15 LAB — HIV-1 RNA QUANT-NO REFLEX-BLD
HIV 1 RNA Quant: 3310 Copies/mL — ABNORMAL HIGH
HIV-1 RNA Quant, Log: 3.52 Log cps/mL — ABNORMAL HIGH

## 2021-12-15 LAB — RPR: RPR Ser Ql: REACTIVE — AB

## 2021-12-15 LAB — RPR TITER: RPR Titer: 1:4 {titer} — ABNORMAL HIGH

## 2022-01-27 ENCOUNTER — Telehealth: Payer: Self-pay

## 2022-01-27 NOTE — Telephone Encounter (Signed)
Working viral load list, [facility] confirms that patient's [employer] Odor has not been dispensed since 12/2020. Sending MyChart message to patient to further assess.  ? ?Beryle Flock, RN ? ?

## 2022-05-22 ENCOUNTER — Encounter (HOSPITAL_BASED_OUTPATIENT_CLINIC_OR_DEPARTMENT_OTHER): Payer: Self-pay | Admitting: Emergency Medicine

## 2022-05-22 ENCOUNTER — Emergency Department (HOSPITAL_BASED_OUTPATIENT_CLINIC_OR_DEPARTMENT_OTHER)
Admission: EM | Admit: 2022-05-22 | Discharge: 2022-05-23 | Disposition: A | Discharge status: Home / Self Care | Payer: Self-pay | Attending: Emergency Medicine | Admitting: Emergency Medicine

## 2022-05-22 ENCOUNTER — Other Ambulatory Visit: Payer: Self-pay

## 2022-05-22 DIAGNOSIS — Z21 Asymptomatic human immunodeficiency virus [HIV] infection status: Secondary | ICD-10-CM | POA: Insufficient documentation

## 2022-05-22 DIAGNOSIS — Z87891 Personal history of nicotine dependence: Secondary | ICD-10-CM | POA: Insufficient documentation

## 2022-05-22 DIAGNOSIS — J029 Acute pharyngitis, unspecified: Secondary | ICD-10-CM | POA: Insufficient documentation

## 2022-05-22 NOTE — ED Triage Notes (Signed)
Sore throat for two days, trouble swallowing. Reports left sided headaches.

## 2022-05-23 ENCOUNTER — Encounter (HOSPITAL_BASED_OUTPATIENT_CLINIC_OR_DEPARTMENT_OTHER): Payer: Self-pay

## 2022-05-23 ENCOUNTER — Emergency Department (HOSPITAL_BASED_OUTPATIENT_CLINIC_OR_DEPARTMENT_OTHER): Payer: Self-pay

## 2022-05-23 LAB — CBC WITH DIFFERENTIAL/PLATELET
Abs Immature Granulocytes: 0.01 10*3/uL (ref 0.00–0.07)
Basophils Absolute: 0 10*3/uL (ref 0.0–0.1)
Basophils Relative: 0 %
Eosinophils Absolute: 0.1 10*3/uL (ref 0.0–0.5)
Eosinophils Relative: 1 %
HCT: 39 % (ref 39.0–52.0)
Hemoglobin: 14.4 g/dL (ref 13.0–17.0)
Immature Granulocytes: 0 %
Lymphocytes Relative: 28 %
Lymphs Abs: 1.7 10*3/uL (ref 0.7–4.0)
MCH: 32.4 pg (ref 26.0–34.0)
MCHC: 36.9 g/dL — ABNORMAL HIGH (ref 30.0–36.0)
MCV: 87.8 fL (ref 80.0–100.0)
Monocytes Absolute: 0.5 10*3/uL (ref 0.1–1.0)
Monocytes Relative: 9 %
Neutro Abs: 3.7 10*3/uL (ref 1.7–7.7)
Neutrophils Relative %: 62 %
Platelets: 260 10*3/uL (ref 150–400)
RBC: 4.44 MIL/uL (ref 4.22–5.81)
RDW: 12.3 % (ref 11.5–15.5)
WBC: 5.9 10*3/uL (ref 4.0–10.5)
nRBC: 0 % (ref 0.0–0.2)

## 2022-05-23 LAB — BASIC METABOLIC PANEL
Anion gap: 5 (ref 5–15)
BUN: 6 mg/dL (ref 6–20)
CO2: 26 mmol/L (ref 22–32)
Calcium: 9.1 mg/dL (ref 8.9–10.3)
Chloride: 107 mmol/L (ref 98–111)
Creatinine, Ser: 0.91 mg/dL (ref 0.61–1.24)
GFR, Estimated: >60 mL/min (ref >60)
Glucose, Bld: 97 mg/dL (ref 70–99)
Potassium: 3.4 mmol/L — ABNORMAL LOW (ref 3.5–5.1)
Sodium: 138 mmol/L (ref 135–145)

## 2022-05-23 LAB — MONONUCLEOSIS SCREEN: Mono Screen: NEGATIVE

## 2022-05-23 LAB — GROUP A STREP BY PCR: Group A Strep by PCR: NOT DETECTED

## 2022-05-23 MED ORDER — IBUPROFEN 800 MG PO TABS
800.0000 mg | ORAL_TABLET | Freq: Once | ORAL | Status: AC
  Administered 2022-05-23: 800 mg via ORAL
  Filled 2022-05-23: qty 1

## 2022-05-23 MED ORDER — ONDANSETRON HCL 4 MG/2ML IJ SOLN
4.0000 mg | Freq: Once | INTRAMUSCULAR | Status: AC
  Administered 2022-05-23: 4 mg via INTRAVENOUS
  Filled 2022-05-23: qty 2

## 2022-05-23 MED ORDER — IOHEXOL 300 MG/ML  SOLN
75.0000 mL | Freq: Once | INTRAMUSCULAR | Status: AC | PRN
  Administered 2022-05-23: 75 mL via INTRAVENOUS

## 2022-05-23 MED ORDER — HYDROCODONE-ACETAMINOPHEN 7.5-325 MG/15ML PO SOLN: 10.0000 mL | ORAL | 0 refills | Status: DC | PRN

## 2022-05-23 MED ORDER — FENTANYL CITRATE PF 50 MCG/ML IJ SOSY
100.0000 ug | PREFILLED_SYRINGE | Freq: Once | INTRAMUSCULAR | Status: AC
  Administered 2022-05-23: 100 ug via INTRAVENOUS
  Filled 2022-05-23: qty 2

## 2022-05-23 MED ORDER — DEXAMETHASONE SODIUM PHOSPHATE 10 MG/ML IJ SOLN
10.0000 mg | Freq: Once | INTRAMUSCULAR | Status: AC
Start: 2022-05-23 — End: 2022-05-23
  Administered 2022-05-23: 10 mg via INTRAMUSCULAR
  Filled 2022-05-23: qty 1

## 2022-05-23 MED ORDER — LIDOCAINE VISCOUS HCL 2 % MT SOLN
15.0000 mL | Freq: Once | OROMUCOSAL | Status: AC
  Administered 2022-05-23: 15 mL via OROMUCOSAL
  Filled 2022-05-23: qty 15

## 2022-05-23 MED ORDER — PENICILLIN G BENZATHINE 1200000 UNIT/2ML IM SUSY
1.2000 10*6.[IU] | PREFILLED_SYRINGE | Freq: Once | INTRAMUSCULAR | Status: AC
  Administered 2022-05-23: 1.2 10*6.[IU] via INTRAMUSCULAR
  Filled 2022-05-23: qty 2

## 2022-05-23 NOTE — ED Notes (Signed)
To CT at this time.

## 2022-05-23 NOTE — ED Provider Notes (Signed)
[facility]-EMERGENCY DEPT [facility] Provider Note: Lowella Dell, MD, FACEP  CSN: [id number] MRN: [id number] ARRIVAL: 05/22/22 at 2323 ROOM: MH09/MH09   CHIEF COMPLAINT  Sore Throat   HISTORY OF PRESENT ILLNESS  05/23/22 1:11 AM Paul Palmer is a 36 y.o. male with HIV on Biktarvy.  His absolute CD4 count was 375 on 12/11/2021.  He is here with 2 days of sore throat, worse with swallowing.  He rates the pain as a 10 out of 10.  He has had associated left-sided facial pain and a tender, swollen left anterior cervical lymph node.  He does not know if he has had a fever.  He has been taking ibuprofen without relief.   Past Medical History:  Diagnosis Date   HIV (human immunodeficiency virus infection) (HCC)     Past Surgical History:  Procedure Laterality Date   none      Family History  Problem Relation Age of Onset   Hypertension Mother        and grandparents   Diabetes Other        grandparents (deceased)    Social History   Tobacco Use   Smoking status: Former    Packs/day: 0.25    Years: 9.00    Total pack years: 2.25    Types: Cigarettes, E-cigarettes    Start date: 08/22/2005   Smokeless tobacco: Never   Tobacco comments:    Patient vapes, encouraged cessation   Vaping Use   Vaping Use: Every day  Substance Use Topics   Alcohol use: Yes    Alcohol/week: 0.0 standard drinks of alcohol    Comment: occ   Drug use: Yes    Frequency: 7.0 times per week    Types: Marijuana    Comment: daily    Prior to Admission medications   Medication Sig Start Date End Date Taking? Authorizing Provider  HYDROcodone-acetaminophen (HYCET) 7.5-325 mg/15 ml solution Take 10 mLs by mouth every 4 (four) hours as needed for moderate pain or severe pain. 05/23/22  Yes Brook Mall, MD  BIKTARVY 50-200-25 MG TABS tablet TAKE 1 TABLET BY MOUTH DAILY 12/08/21   Cliffton Asters, MD    Allergies Patient has no known allergies.   REVIEW OF SYSTEMS  Negative except as noted here or in the  History of Present Illness.   PHYSICAL EXAMINATION  Initial Vital Signs Blood pressure 134/86, pulse 70, temperature 98.7 F (37.1 C), resp. rate 18, height 6\' 4"  (1.93 m), weight 79.4 kg, SpO2 100 %.  Examination General: Well-developed, well-nourished male in no acute distress; appearance consistent with age of record HENT: normocephalic; atraumatic; TMs normal; pharynx poorly visualized due to trismus Eyes: pupils equal, round and reactive to light; extraocular muscles intact Neck: supple; tender, enlarged left anterior cervical lymph node Heart: regular rate and rhythm Lungs: clear to auscultation bilaterally Abdomen: soft; nondistended; nontender; bowel sounds present Extremities: No deformity; full range of motion; pulses normal Neurologic: Awake, alert and oriented; motor function intact in all extremities and symmetric; no facial droop Skin: Warm and dry Psychiatric: Flat affect   RESULTS  Summary of this visit's results, reviewed and interpreted by myself:   EKG Interpretation  Date/Time:    Ventricular Rate:    PR Interval:    QRS Duration:   QT Interval:    QTC Calculation:   R Axis:     Text Interpretation:         Laboratory Studies: Results for orders placed or performed during the hospital encounter of  05/22/22 (from the past 24 hour(s))  Group A Strep by PCR     Status: None   Collection Time: 05/22/22 11:35 PM   Specimen: Throat; Sterile Swab  Result Value Ref Range   Group A Strep by PCR NOT DETECTED NOT DETECTED  CBC with Differential     Status: Abnormal   Collection Time: 05/23/22  1:50 AM  Result Value Ref Range   WBC 5.9 4.0 - 10.5 K/uL   RBC 4.44 4.22 - 5.81 MIL/uL   Hemoglobin 14.4 13.0 - 17.0 g/dL   HCT 39.0 39.0 - 52.0 %   MCV 87.8 80.0 - 100.0 fL   MCH 32.4 26.0 - 34.0 pg   MCHC 36.9 (H) 30.0 - 36.0 g/dL   RDW 12.3 11.5 - 15.5 %   Platelets 260 150 - 400 K/uL   nRBC 0.0 0.0 - 0.2 %   Neutrophils Relative % 62 %   Neutro Abs 3.7  1.7 - 7.7 K/uL   Lymphocytes Relative 28 %   Lymphs Abs 1.7 0.7 - 4.0 K/uL   Monocytes Relative 9 %   Monocytes Absolute 0.5 0.1 - 1.0 K/uL   Eosinophils Relative 1 %   Eosinophils Absolute 0.1 0.0 - 0.5 K/uL   Basophils Relative 0 %   Basophils Absolute 0.0 0.0 - 0.1 K/uL   Immature Granulocytes 0 %   Abs Immature Granulocytes 0.01 0.00 - 0.07 K/uL  Basic metabolic panel     Status: Abnormal   Collection Time: 05/23/22  1:50 AM  Result Value Ref Range   Sodium 138 135 - 145 mmol/L   Potassium 3.4 (L) 3.5 - 5.1 mmol/L   Chloride 107 98 - 111 mmol/L   CO2 26 22 - 32 mmol/L   Glucose, Bld 97 70 - 99 mg/dL   BUN 6 6 - 20 mg/dL   Creatinine, Ser 0.91 0.61 - 1.24 mg/dL   Calcium 9.1 8.9 - 10.3 mg/dL   GFR, Estimated >60 >60 mL/min   Anion gap 5 5 - 15  Mononucleosis screen     Status: None   Collection Time: 05/23/22  1:50 AM  Result Value Ref Range   Mono Screen NEGATIVE NEGATIVE   Imaging Studies: CT Soft Tissue Neck W Contrast  Result Date: 05/23/2022 CLINICAL DATA:  Soft tissue swelling EXAM: CT NECK WITH CONTRAST TECHNIQUE: Multidetector CT imaging of the neck was performed using the standard protocol following the bolus administration of intravenous contrast. RADIATION DOSE REDUCTION: This exam was performed according to the departmental dose-optimization program which includes automated exposure control, adjustment of the mA and/or kV according to patient size and/or use of iterative reconstruction technique. CONTRAST:  60mL OMNIPAQUE IOHEXOL 300 MG/ML  SOLN COMPARISON:  None Available. FINDINGS: PHARYNX AND LARYNX: The palatine tonsils are enlarged, left-greater-than-right. No peritonsillar or retropharyngeal abscess. The larynx is normal. SALIVARY GLANDS: Normal parotid, submandibular and sublingual glands. THYROID: Normal. LYMPH NODES: Bilateral reactive lymphadenopathy. Lower facial subcutaneous induration. VASCULAR: Major cervical vessels are patent. LIMITED INTRACRANIAL:  Normal. VISUALIZED ORBITS: Normal. MASTOIDS AND VISUALIZED PARANASAL SINUSES: No fluid levels or advanced mucosal thickening. No mastoid effusion. SKELETON: No bony spinal canal stenosis. No lytic or blastic lesions. UPPER CHEST: Clear. OTHER: None. IMPRESSION: 1. Acute tonsillopharyngitis without peritonsillar or retropharyngeal abscess. 2. Bilateral reactive lymphadenopathy. Electronically Signed   By: Ulyses Jarred M.D.   On: 05/23/2022 03:19    ED COURSE and MDM  Nursing notes, initial and subsequent vitals signs, including pulse oximetry, reviewed and interpreted by  myself.  Vitals:   05/22/22 2330 05/22/22 2332 05/23/22 0200 05/23/22 0245  BP: 134/86  120/82 119/78  Pulse: 70  63 (!) 56  Resp: 18  20 18   Temp: 98.7 F (37.1 C)     SpO2: 100%  98% 97%  Weight:  79.4 kg    Height:  6\' 4"  (1.93 m)     Medications  dexamethasone (DECADRON) injection 10 mg (has no administration in time range)  penicillin g benzathine (BICILLIN LA) 1200000 UNIT/2ML injection 1.2 Million Units (has no administration in time range)  ibuprofen (ADVIL) tablet 800 mg (800 mg Oral Given 05/23/22 0054)  lidocaine (XYLOCAINE) 2 % viscous mouth solution 15 mL (15 mLs Mouth/Throat Given 05/23/22 0054)  ondansetron (ZOFRAN) injection 4 mg (4 mg Intravenous Given 05/23/22 0151)  fentaNYL (SUBLIMAZE) injection 100 mcg (100 mcg Intravenous Given 05/23/22 0151)  iohexol (OMNIPAQUE) 300 MG/ML solution 75 mL (75 mLs Intravenous Contrast Given 05/23/22 0249)   3:24 AM CT scan is consistent with acute tonsillopharyngitis.  He is negative for strep but given his borderline immune compromised he may benefit from Bicillin.  We will also give him dexamethasone to help reduce pharyngeal edema.  PROCEDURES  Procedures   ED DIAGNOSES     ICD-10-CM   1. Tonsillopharyngitis  J02.9          Shraga Custard, MD 05/23/22 [id number]

## 2022-05-23 NOTE — ED Notes (Signed)
EDP at bedside  

## 2022-06-11 ENCOUNTER — Ambulatory Visit: Payer: Self-pay | Admitting: Internal Medicine

## 2022-06-11 ENCOUNTER — Telehealth: Payer: Self-pay

## 2022-06-11 NOTE — Telephone Encounter (Signed)
Attempted to call patient regarding appointment to confirm if he was on the way to this appointment today or if we need to reschedule. Call not able to be complete at this time.  Voicemail not setup. Juanita Laster, RMA

## 2022-10-15 ENCOUNTER — Ambulatory Visit (INDEPENDENT_AMBULATORY_CARE_PROVIDER_SITE_OTHER): Payer: Self-pay

## 2022-10-15 ENCOUNTER — Other Ambulatory Visit: Payer: Self-pay | Admitting: Pharmacist

## 2022-10-15 ENCOUNTER — Ambulatory Visit: Payer: Self-pay

## 2022-10-15 ENCOUNTER — Ambulatory Visit (INDEPENDENT_AMBULATORY_CARE_PROVIDER_SITE_OTHER): Payer: Self-pay | Admitting: Internal Medicine

## 2022-10-15 ENCOUNTER — Encounter: Payer: Self-pay | Admitting: Internal Medicine

## 2022-10-15 ENCOUNTER — Other Ambulatory Visit: Payer: Self-pay

## 2022-10-15 VITALS — BP 132/80 | HR 73 | Temp 98.4°F | Ht 76.0 in | Wt 174.0 lb

## 2022-10-15 DIAGNOSIS — B20 Human immunodeficiency virus [HIV] disease: Secondary | ICD-10-CM

## 2022-10-15 DIAGNOSIS — Z23 Encounter for immunization: Secondary | ICD-10-CM

## 2022-10-15 DIAGNOSIS — Z8619 Personal history of other infectious and parasitic diseases: Secondary | ICD-10-CM

## 2022-10-15 DIAGNOSIS — A5149 Other secondary syphilitic conditions: Secondary | ICD-10-CM

## 2022-10-15 MED ORDER — BIKTARVY 50-200-25 MG PO TABS: 1.0000 | ORAL_TABLET | Freq: Every day | ORAL | 11 refills | Status: DC

## 2022-10-15 MED ORDER — BICTEGRAVIR-EMTRICITAB-TENOFOV 50-200-25 MG PO TABS: 1.0000 | ORAL_TABLET | Freq: Every day | ORAL | 0 refills | Status: AC

## 2022-10-15 NOTE — Progress Notes (Signed)
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  Modified Medications   Modified Medication Previous Medication   BICTEGRAVIR-EMTRICITABINE-TENOFOVIR AF (BIKTARVY) 50-200-25 MG TABS TABLET BIKTARVY 50-200-25 MG TABS tablet      Take 1 tablet by mouth daily.    TAKE 1 TABLET BY MOUTH DAILY  Discontinued Medications   No medications on file    Subjective: Paul Palmer is in for his routine HIV follow-up visit.  He has not been consistently on Biktarvy for the past 2 years.  He says that he took samples for a short period after his visit here in February.  For "several weeks".  He has not renewed his ADAP.  He recently moved to a new apartment.  He is working at [employer].  He is in a relationship with 1 male partner.  He was in the emergency department in July with tonsillitis.  Testing for group A strep and mononucleosis were both negative.  His sore throat has resolved.  He states that he is feeling well.  He has had an annual influenza vaccine but has not had an updated COVID booster yet.  He is getting regular exercise.  Review of Systems: Review of Systems  Constitutional:  Negative for fever,  malaise/fatigue and weight loss.  HENT:  Negative for sore throat.   Skin:  Negative for rash.  Psychiatric/Behavioral:  Negative for depression.     Past Medical History:  Diagnosis Date   HIV (human immunodeficiency virus infection) (HCC)     Social History   Tobacco Use   Smoking status: Former    Packs/day: 0.25    Years: 9.00    Total pack years: 2.25    Types: E-cigarettes, Cigarettes    Start date: 08/22/2005   Smokeless tobacco: Never   Tobacco comments:    Patient vapes, encouraged cessation   Vaping Use   Vaping Use: Every day  Substance Use Topics   Alcohol use: Yes    Alcohol/week: 0.0 standard drinks of alcohol    Comment: occ   Drug use: Yes    Frequency: 7.0 times per week    Types: Marijuana    Comment: daily    Family History  Problem Relation Age of Onset   Hypertension Mother        and grandparents   Diabetes Other        grandparents (deceased)    No Known Allergies  Health Maintenance  Topic Date Due   Hepatitis C Screening  Never done   DTaP/Tdap/Td (1 - Tdap) Never done   COVID-19 Vaccine (3 - Pfizer risk series)  08/21/2020   INFLUENZA VACCINE  06/02/2022   HIV Screening  Completed   HPV VACCINES  Aged Out    Objective:  Vitals:   10/15/22 0902  BP: 132/80  Pulse: 73  Temp: 98.4 F (36.9 C)  TempSrc: Oral  SpO2: 98%  Weight: 179 lb (81.2 kg)  Height: 6\' 4"  (1.93 m)   Body mass index is 21.79 kg/m.  Physical Exam Constitutional:      Comments: He is very pleasant and talkative.  HENT:     Mouth/Throat:     Mouth: Mucous membranes are moist.     Pharynx: Oropharynx is clear. No oropharyngeal exudate or posterior oropharyngeal erythema.     Comments: He has enlarged tonsils bilaterally but no exudates. Cardiovascular:     Rate and Rhythm: Normal rate and regular rhythm.     Heart sounds: Normal heart sounds.  Pulmonary:     Effort: Pulmonary effort is normal.     Breath sounds: Normal breath sounds.   Psychiatric:        Mood and Affect: Mood normal.     Lab Results Lab Results  Component Value Date   WBC 5.9 05/23/2022   HGB 14.4 05/23/2022   HCT 39.0 05/23/2022   MCV 87.8 05/23/2022   PLT 260 05/23/2022    Lab Results  Component Value Date   CREATININE 0.91 05/23/2022   BUN 6 05/23/2022   NA 138 05/23/2022   K 3.4 (L) 05/23/2022   CL 107 05/23/2022   CO2 26 05/23/2022    Lab Results  Component Value Date   ALT 15 10/07/2021   AST 18 10/07/2021   ALKPHOS 47 02/03/2020   BILITOT 0.5 10/07/2021    Lab Results  Component Value Date   CHOL 153 10/07/2021   HDL 33 (L) 10/07/2021   LDLCALC 92 10/07/2021   TRIG 188 (H) 10/07/2021   CHOLHDL 4.6 10/07/2021   Lab Results  Component Value Date   LABRPR REACTIVE (A) 12/11/2021   RPRTITER 1:4 (H) 12/11/2021   HIV 1 RNA Quant (Copies/mL)  Date Value  12/11/2021 3,310 (H)  10/07/2021 16,200 (H)  08/12/2020 <20   CD4 T Cell Abs (/uL)  Date Value  10/07/2021 437  08/12/2020 498  01/30/2020 203 (L)     Problem List Items Addressed This Visit       High   HIV disease (HCC) - Primary    His infection has been poorly controlled for the past 2 years because of difficulty adhering to [place].  He will meet with our financial counselor today to start the renewal process for ADAP.  We will give him Biktarvy samples.  He will get updated blood work and urine for GC and chlamydia.  He received his updated COVID-vaccine here today.  He will follow-up in 6 weeks.      Relevant Medications   bictegravir-emtricitabine-tenofovir AF (BIKTARVY) 50-200-25 MG TABS tablet   Other Relevant Orders   T-helper cells (CD4) count (not at [facility])   HIV-1 RNA quant-no reflex-bld   RPR   Urine cytology ancillary only     Medium    Secondary syphilis in male    His RPR was down to 1:4 in February after treatment for syphilis last year.  He will get a repeat RPR today.      Relevant Medications   bictegravir-emtricitabine-tenofovir  AF (BIKTARVY) 50-200-25 MG TABS tablet   History of gonorrhea    I will screen him for GC and chlamydia.  Cliffton Asters, MD [facility] for Infectious Disease [facility] [phone] pager   [phone] cell 10/15/2022, 9:19 AM

## 2022-10-15 NOTE — Progress Notes (Signed)
Medication Samples have been provided to the patient.  Drug name: Biktarvy        Strength: 50/200/25 mg       Qty: 14 tablets (2 bottles) LOT: CPBDLA   Exp.Date: 3/26  Dosing instructions: Take one tablet by mouth once daily  The patient has been instructed regarding the correct time, dose, and frequency of taking this medication, including desired effects and most common side effects.   Abdullah Rizzi, PharmD, CPP, BCIDP, AAHIVP Clinical Pharmacist Practitioner Infectious Diseases Clinical Pharmacist [facility]  

## 2022-10-15 NOTE — Addendum Note (Signed)
Addended by: Wyvonne Lenz on: 10/15/2022 09:29 AM   Modules accepted: Orders

## 2022-10-15 NOTE — Assessment & Plan Note (Signed)
I will screen him for GC and chlamydia.

## 2022-10-15 NOTE — Assessment & Plan Note (Signed)
His infection has been poorly controlled for the past 2 years because of difficulty adhering to [place].  He will meet with our financial counselor today to start the renewal process for ADAP.  We will give him Biktarvy samples.  He will get updated blood work and urine for GC and chlamydia.  He received his updated COVID-vaccine here today.  He will follow-up in 6 weeks.

## 2022-10-15 NOTE — Assessment & Plan Note (Signed)
His RPR was down to 1:4 in February after treatment for syphilis last year.  He will get a repeat RPR today.

## 2022-10-16 LAB — URINE CYTOLOGY ANCILLARY ONLY
Chlamydia: NEGATIVE
Comment: NEGATIVE
Comment: NORMAL
Neisseria Gonorrhea: NEGATIVE

## 2022-10-16 LAB — T-HELPER CELLS (CD4) COUNT (NOT AT ARMC)
CD4 % Helper T Cell: 24 % — ABNORMAL LOW (ref 33–65)
CD4 T Cell Abs: 404 /uL (ref 400–1790)

## 2022-10-19 LAB — RPR: RPR Ser Ql: REACTIVE — AB

## 2022-10-19 LAB — RPR TITER: RPR Titer: 1:2 {titer} — ABNORMAL HIGH

## 2022-10-19 LAB — T PALLIDUM AB: T Pallidum Abs: POSITIVE — AB

## 2022-10-19 LAB — HIV-1 RNA QUANT-NO REFLEX-BLD
HIV 1 RNA Quant: 26000 Copies/mL — ABNORMAL HIGH
HIV-1 RNA Quant, Log: 4.41 Log cps/mL — ABNORMAL HIGH

## 2022-10-22 ENCOUNTER — Other Ambulatory Visit (HOSPITAL_COMMUNITY): Payer: Self-pay

## 2022-11-04 ENCOUNTER — Other Ambulatory Visit (HOSPITAL_COMMUNITY): Payer: Self-pay

## 2022-11-05 ENCOUNTER — Other Ambulatory Visit: Payer: Self-pay | Admitting: Pharmacist

## 2022-11-05 DIAGNOSIS — B20 Human immunodeficiency virus [HIV] disease: Secondary | ICD-10-CM

## 2022-11-05 MED ORDER — BIKTARVY 50-200-25 MG PO TABS: 1.0000 | ORAL_TABLET | Freq: Every day | ORAL | 0 refills | Status: AC

## 2022-11-05 NOTE — Progress Notes (Signed)
Medication Samples have been provided to the patient.  Drug name: Biktarvy        Strength: 50/200/25 mg       Qty: 14 tablets (2 bottles)  LOT: CPBDCA   Exp.Date: 12/2024  Dosing instructions: Take one tablet by mouth once daily  The patient has been instructed regarding the correct time, dose, and frequency of taking this medication, including desired effects and most common side effects.   Teriana Danker L. Hendel Gatliff, PharmD, BCIDP, AAHIVP, CPP Clinical Pharmacist Practitioner Infectious Diseases Clinical Pharmacist [facility] 10/14/2020, 10:07 AM  

## 2022-11-18 ENCOUNTER — Other Ambulatory Visit: Payer: Self-pay

## 2022-11-18 ENCOUNTER — Other Ambulatory Visit (HOSPITAL_COMMUNITY): Payer: Self-pay

## 2022-11-18 ENCOUNTER — Ambulatory Visit (INDEPENDENT_AMBULATORY_CARE_PROVIDER_SITE_OTHER): Payer: Self-pay | Admitting: Internal Medicine

## 2022-11-18 ENCOUNTER — Encounter: Payer: Self-pay | Admitting: Internal Medicine

## 2022-11-18 VITALS — BP 131/56 | HR 70 | Temp 98.0°F | Resp 16 | Ht 76.0 in | Wt 174.4 lb

## 2022-11-18 DIAGNOSIS — B20 Human immunodeficiency virus [HIV] disease: Secondary | ICD-10-CM

## 2022-11-18 DIAGNOSIS — A5149 Other secondary syphilitic conditions: Secondary | ICD-10-CM

## 2022-11-18 NOTE — Progress Notes (Signed)
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   BICTEGRAVIR-EMTRICITABINE-TENOFOVIR AF (BIKTARVY) 50-200-25 MG TABS TABLET    Take 1 tablet by mouth daily for 14 days.   HYDROCODONE-ACETAMINOPHEN (HYCET) 7.5-325 MG/15 ML SOLUTION    Take 10 mLs by mouth every 4 (four) hours as needed for moderate pain or severe pain.  Modified Medications   No medications on file  Discontinued Medications   No medications on file    Subjective: Paul Palmer is in for his routine follow-up visit.  He restarted Biktarvy using samples from our clinic about 3 weeks ago.  He is still waiting for his first month supply to come from [facility] order pharmacy.  He takes his Airline pilot each day around lunchtime.  He is not having any problems tolerating it and has not missed any doses.  Review of Systems: Review of Systems  Constitutional:  Negative for fever.  Gastrointestinal:  Negative for abdominal pain, diarrhea, nausea and vomiting.    Past Medical History:  Diagnosis Date   HIV (human immunodeficiency virus infection) ([place])     Social History   Tobacco Use   Smoking status: Every Day    Packs/day: 0.25    Years: 9.00    Total pack years: 2.25     Types: E-cigarettes, Cigarettes    Start date: 08/22/2005   Smokeless tobacco: Never   Tobacco comments:    Patient vapes, encouraged cessation   Vaping Use   Vaping Use: Every day  Substance Use Topics   Alcohol use: Yes    Alcohol/week: 0.0 standard drinks of alcohol    Comment: occ   Drug use: Yes    Frequency: 7.0 times per week    Types: Marijuana    Comment: daily    Family History  Problem Relation Age of Onset   Hypertension Mother        and grandparents   Diabetes Other        grandparents (deceased)    No Known Allergies  Health Maintenance  Topic Date Due   Hepatitis C Screening  Never done   DTaP/Tdap/Td (1 - Tdap) Never done   INFLUENZA VACCINE  06/02/2022   COVID-19 Vaccine (4 - 2023-24 season) 12/10/2022   HIV Screening  Completed   HPV VACCINES  Aged Out    Objective:  Vitals:   11/18/22 0839  BP: (!) 131/56  Pulse: 70  Resp: 16  Temp: 98 F (36.7 C)  TempSrc: Oral  SpO2: 99%  Weight: 174 lb 6.4 oz (79.1 kg)  Height: 6\' 4"  (1.93 m)   Body mass index is 21.23 kg/m.  Physical Exam Constitutional:  Comments: He is calm and pleasant.  Cardiovascular:     Rate and Rhythm: Normal rate and regular rhythm.     Heart sounds: No murmur heard. Pulmonary:     Effort: Pulmonary effort is normal.     Breath sounds: Normal breath sounds.  Psychiatric:        Mood and Affect: Mood normal.     Lab Results Lab Results  Component Value Date   WBC 5.9 05/23/2022   HGB 14.4 05/23/2022   HCT 39.0 05/23/2022   MCV 87.8 05/23/2022   PLT 260 05/23/2022    Lab Results  Component Value Date   CREATININE 0.91 05/23/2022   BUN 6 05/23/2022   NA 138 05/23/2022   K 3.4 (L) 05/23/2022   CL 107 05/23/2022   CO2 26 05/23/2022    Lab Results  Component Value Date   ALT 15 10/07/2021   AST 18 10/07/2021   ALKPHOS 47 02/03/2020   BILITOT 0.5 10/07/2021    Lab Results  Component Value Date   CHOL 153 10/07/2021   HDL 33 (L) 10/07/2021    LDLCALC 92 10/07/2021   TRIG 188 (H) 10/07/2021   CHOLHDL 4.6 10/07/2021   Lab Results  Component Value Date   LABRPR REACTIVE (A) 10/15/2022   RPRTITER 1:2 (H) 10/15/2022   HIV 1 RNA Quant (Copies/mL)  Date Value  10/15/2022 26,000 (H)  12/11/2021 3,310 (H)  10/07/2021 16,200 (H)   CD4 T Cell Abs (/uL)  Date Value  10/15/2022 404  10/07/2021 437  08/12/2020 498     Problem List Items Addressed This Visit       High   HIV disease ([place]) - Primary    Our pharmacy staff are checking with [place] to make sure he can get a steady supply of Biktarvy.  He will update his lab work today and follow-up in 1 month.      Relevant Orders   T-helper cells (CD4) count (not at [facility])   HIV-1 RNA quant-no reflex-bld   CBC   Comprehensive metabolic panel     Medium    Secondary syphilis in male    His RPR has come down to 1:2 following treatment for syphilis 2 years ago.  His recent screening tests for GC and chlamydia were both negative.         Michel Bickers, MD [facility] for Infectious [place] [facility] [phone] pager   [phone] cell 11/18/2022, 8:51 AM

## 2022-11-18 NOTE — Assessment & Plan Note (Signed)
His RPR has come down to 1:2 following treatment for syphilis 2 years ago.  His recent screening tests for GC and chlamydia were both negative.

## 2022-11-18 NOTE — Assessment & Plan Note (Signed)
Our pharmacy staff are checking with [place] to make sure he can get a steady supply of Biktarvy.  He will update his lab work today and follow-up in 1 month.

## 2022-11-19 LAB — T-HELPER CELLS (CD4) COUNT (NOT AT ARMC)
CD4 % Helper T Cell: 23 % — ABNORMAL LOW (ref 33–65)
CD4 T Cell Abs: 384 /uL — ABNORMAL LOW (ref 400–1790)

## 2022-11-21 LAB — CBC
HCT: 46.6 % (ref 38.5–50.0)
Hemoglobin: 16.3 g/dL (ref 13.2–17.1)
MCH: 31.9 pg (ref 27.0–33.0)
MCHC: 35 g/dL (ref 32.0–36.0)
MCV: 91.2 fL (ref 80.0–100.0)
MPV: 9.7 fL (ref 7.5–12.5)
Platelets: 359 10*3/uL (ref 140–400)
RBC: 5.11 10*6/uL (ref 4.20–5.80)
RDW: 12.2 % (ref 11.0–15.0)
WBC: 3.5 10*3/uL — ABNORMAL LOW (ref 3.8–10.8)

## 2022-11-21 LAB — COMPREHENSIVE METABOLIC PANEL
AG Ratio: 1.4 (calc) (ref 1.0–2.5)
ALT: 15 U/L (ref 9–46)
AST: 15 U/L (ref 10–40)
Albumin: 4.6 g/dL (ref 3.6–5.1)
Alkaline phosphatase (APISO): 63 U/L (ref 36–130)
BUN: 10 mg/dL (ref 7–25)
CO2: 29 mmol/L (ref 20–32)
Calcium: 9.7 mg/dL (ref 8.6–10.3)
Chloride: 104 mmol/L (ref 98–110)
Creat: 1.01 mg/dL (ref 0.60–1.26)
Globulin: 3.3 g/dL (calc) (ref 1.9–3.7)
Glucose, Bld: 90 mg/dL (ref 65–99)
Potassium: 4.4 mmol/L (ref 3.5–5.3)
Sodium: 141 mmol/L (ref 135–146)
Total Bilirubin: 0.7 mg/dL (ref 0.2–1.2)
Total Protein: 7.9 g/dL (ref 6.1–8.1)

## 2022-11-21 LAB — HIV-1 RNA QUANT-NO REFLEX-BLD
HIV 1 RNA Quant: 22 Copies/mL — ABNORMAL HIGH
HIV-1 RNA Quant, Log: 1.34 Log cps/mL — ABNORMAL HIGH

## 2022-11-23 ENCOUNTER — Other Ambulatory Visit (HOSPITAL_COMMUNITY): Payer: Self-pay

## 2022-12-16 ENCOUNTER — Ambulatory Visit (INDEPENDENT_AMBULATORY_CARE_PROVIDER_SITE_OTHER): Payer: Self-pay | Admitting: Internal Medicine

## 2022-12-16 ENCOUNTER — Other Ambulatory Visit: Payer: Self-pay

## 2022-12-16 ENCOUNTER — Other Ambulatory Visit (HOSPITAL_COMMUNITY): Payer: Self-pay

## 2022-12-16 DIAGNOSIS — B20 Human immunodeficiency virus [HIV] disease: Secondary | ICD-10-CM

## 2022-12-16 NOTE — Progress Notes (Signed)
Virtual Visit via Telephone Note  I connected with Paul Palmer on 12/16/22 at  9:30 AM EST by telephone and verified that I am speaking with the correct person using two identifiers.  Location: Patient: Home Provider: [facility]   I discussed the limitations, risks, security and privacy concerns of performing an evaluation and management service by telephone and the availability of in person appointments. I also discussed with the patient that there may be a patient responsible charge related to this service. The patient expressed understanding and agreed to proceed.   History of Present Illness: I called and spoke with Paul Palmer.  He has been taking Biktarvy from temporary supply as he has gotten here in our clinic.  He is still not gotten his pharmacy to fill his [place].  He filled out his ADAP paperwork but apparently we never received an approval letter.  He says that he has not missed any doses of Biktarvy but only has 3 doses left.  He is feeling well.   Observations/Objective: 11/18/2022 CD4 count 384 HIV viral load 22  10/15/2022 RPR 1:2  Assessment and Plan: His infection quickly came under control after restarting Biktarvy a few months ago.  We will work with our Development worker, community and his pharmacy to do what we can to make sure he can get a steady supply of Biktarvy from his pharmacy.  Follow Up Instructions: Continue Biktarvy Follow-up in 2 months   I discussed the assessment and treatment plan with the patient. The patient was provided an opportunity to ask questions and all were answered. The patient agreed with the plan and demonstrated an understanding of the instructions.   The patient was advised to call back or seek an in-person evaluation if the symptoms worsen or if the condition fails to improve as anticipated.  I provided 16 minutes of non-face-to-face time during this encounter.   Michel Bickers, MD

## 2022-12-22 ENCOUNTER — Other Ambulatory Visit: Payer: Self-pay | Admitting: Pharmacist

## 2022-12-22 DIAGNOSIS — B20 Human immunodeficiency virus [HIV] disease: Secondary | ICD-10-CM

## 2022-12-22 MED ORDER — BICTEGRAVIR-EMTRICITAB-TENOFOV 50-200-25 MG PO TABS: 1.0000 | ORAL_TABLET | Freq: Every day | ORAL | 0 refills | Status: AC

## 2022-12-22 NOTE — Progress Notes (Signed)
Medication Samples have been provided to the patient.  Drug name: Biktarvy        Strength: 50/200/25 mg       Qty: 14 tablets (2 bottles) LOT: CPBDCA   Exp.Date: 3/26  Dosing instructions: Take one tablet by mouth once daily  The patient has been instructed regarding the correct time, dose, and frequency of taking this medication, including desired effects and most common side effects.   Alfonse Spruce, PharmD, CPP, BCIDP, [place] Clinical Pharmacist Practitioner Infectious [place] for Infectious Disease

## 2023-02-17 ENCOUNTER — Ambulatory Visit: Payer: Self-pay | Admitting: Internal Medicine

## 2023-08-18 ENCOUNTER — Telehealth: Payer: Self-pay

## 2023-08-18 NOTE — Telephone Encounter (Signed)
Called pt, left message to call to schedule appointment and to renew financial application.

## 2023-12-13 ENCOUNTER — Encounter (HOSPITAL_BASED_OUTPATIENT_CLINIC_OR_DEPARTMENT_OTHER): Payer: Self-pay

## 2023-12-13 ENCOUNTER — Emergency Department (HOSPITAL_BASED_OUTPATIENT_CLINIC_OR_DEPARTMENT_OTHER)
Admission: EM | Admit: 2023-12-13 | Discharge: 2023-12-13 | Disposition: A | Discharge status: Home / Self Care | Payer: Self-pay | Attending: Emergency Medicine | Admitting: Emergency Medicine

## 2023-12-13 ENCOUNTER — Other Ambulatory Visit: Payer: Self-pay

## 2023-12-13 DIAGNOSIS — R197 Diarrhea, unspecified: Secondary | ICD-10-CM | POA: Insufficient documentation

## 2023-12-13 DIAGNOSIS — R1030 Lower abdominal pain, unspecified: Secondary | ICD-10-CM | POA: Insufficient documentation

## 2023-12-13 LAB — COMPREHENSIVE METABOLIC PANEL
ALT: 12 U/L (ref 0–44)
AST: 15 U/L (ref 15–41)
Albumin: 3.4 g/dL — ABNORMAL LOW (ref 3.5–5.0)
Alkaline Phosphatase: 48 U/L (ref 38–126)
Anion gap: 6 (ref 5–15)
BUN: 9 mg/dL (ref 6–20)
CO2: 26 mmol/L (ref 22–32)
Calcium: 8.8 mg/dL — ABNORMAL LOW (ref 8.9–10.3)
Chloride: 103 mmol/L (ref 98–111)
Creatinine, Ser: 0.97 mg/dL (ref 0.61–1.24)
GFR, Estimated: >60 mL/min (ref >60)
Glucose, Bld: 100 mg/dL — ABNORMAL HIGH (ref 70–99)
Potassium: 3.8 mmol/L (ref 3.5–5.1)
Sodium: 135 mmol/L (ref 135–145)
Total Bilirubin: 0.8 mg/dL (ref 0.0–1.2)
Total Protein: 7.5 g/dL (ref 6.5–8.1)

## 2023-12-13 LAB — URINALYSIS, ROUTINE W REFLEX MICROSCOPIC
Bilirubin Urine: NEGATIVE
Glucose, UA: NEGATIVE mg/dL
Ketones, ur: NEGATIVE mg/dL
Leukocytes,Ua: NEGATIVE
Nitrite: NEGATIVE
Protein, ur: NEGATIVE mg/dL
Specific Gravity, Urine: 1.015 (ref 1.005–1.030)
pH: 7 (ref 5.0–8.0)

## 2023-12-13 LAB — URINALYSIS, MICROSCOPIC (REFLEX)

## 2023-12-13 LAB — CBC
HCT: 36.1 % — ABNORMAL LOW (ref 39.0–52.0)
Hemoglobin: 13.1 g/dL (ref 13.0–17.0)
MCH: 32 pg (ref 26.0–34.0)
MCHC: 36.3 g/dL — ABNORMAL HIGH (ref 30.0–36.0)
MCV: 88.3 fL (ref 80.0–100.0)
Platelets: 290 10*3/uL (ref 150–400)
RBC: 4.09 MIL/uL — ABNORMAL LOW (ref 4.22–5.81)
RDW: 12.6 % (ref 11.5–15.5)
WBC: 6.3 10*3/uL (ref 4.0–10.5)
nRBC: 0 % (ref 0.0–0.2)

## 2023-12-13 LAB — MAGNESIUM: Magnesium: 1.8 mg/dL (ref 1.7–2.4)

## 2023-12-13 LAB — LIPASE, BLOOD: Lipase: 31 U/L (ref 11–51)

## 2023-12-13 MED ORDER — SODIUM CHLORIDE 0.9 % IV BOLUS
1000.0000 mL | Freq: Once | INTRAVENOUS | Status: AC
  Administered 2023-12-13: 1000 mL via INTRAVENOUS

## 2023-12-13 MED ORDER — AZITHROMYCIN 250 MG PO TABS
500.0000 mg | ORAL_TABLET | Freq: Once | ORAL | Status: AC
  Administered 2023-12-13: 500 mg via ORAL
  Filled 2023-12-13: qty 2

## 2023-12-13 MED ORDER — AZITHROMYCIN 250 MG PO TABS: 500.0000 mg | ORAL_TABLET | Freq: Every day | ORAL | 0 refills | Status: DC

## 2023-12-13 NOTE — ED Triage Notes (Signed)
 Reports lower abd pain and intermittent constipation and diarrhea since 2/2. Reports "heavy amount" of blood in stool.

## 2023-12-13 NOTE — Discharge Instructions (Addendum)
 Your labs look good.  I am starting you on antibiotics. Follow up with your ID doc in the office.  Return for worsening symptoms.

## 2023-12-13 NOTE — ED Provider Notes (Signed)
 [facility] Provider Note   CSN: [id number] Arrival date & time: 12/13/23  7253     History  Chief Complaint  Patient presents with   Abdominal Pain    Paul Palmer is a 38 y.o. male.  38 yo M with a chief complaints of diarrhea.  Going on for a couple days.  Has been grossly bloody.  Lower abdominal cramping.  No fevers.   Abdominal Pain      Home Medications Prior to Admission medications   Medication Sig Start Date End Date Taking? Authorizing Provider  azithromycin  (ZITHROMAX ) 250 MG tablet Take 2 tablets (500 mg total) by mouth daily for 2 days. 12/13/23 12/15/23 Yes Paul Hughs, DO  bictegravir-emtricitabine -tenofovir  AF (BIKTARVY ) 50-200-25 MG TABS tablet Take 1 tablet by mouth daily. 10/15/22   [name], MD  HYDROcodone -acetaminophen  (HYCET) 7.5-325 mg/15 ml solution Take 10 mLs by mouth every 4 (four) hours as needed for moderate pain or severe pain. Patient not taking: Reported on 10/15/2022 05/23/22   [name], [name], MD      Allergies    Patient has no known allergies.    Review of Systems   Review of Systems  Gastrointestinal:  Positive for abdominal pain.    Physical Exam Updated Vital Signs BP 128/82   Pulse 71   Temp 98.3 F (36.8 C) (Oral)   Resp 17   Ht 6\' 4"  (1.93 m)   Wt 80.3 kg   SpO2 99%   BMI 21.55 kg/m  Physical Exam Vitals and nursing note reviewed.  Constitutional:      Appearance: He is well-developed.  HENT:     Head: Normocephalic and atraumatic.  Eyes:     Pupils: Pupils are equal, round, and reactive to light.  Neck:     Vascular: No JVD.  Cardiovascular:     Rate and Rhythm: Normal rate and regular rhythm.     Heart sounds: No murmur heard.    No friction rub. No gallop.  Pulmonary:     Effort: No respiratory distress.     Breath sounds: No wheezing.  Abdominal:     General: There is no distension.     Tenderness: There is no abdominal tenderness. There is no  guarding or rebound.     Comments: Benign abdominal exam  Musculoskeletal:        General: Normal range of motion.     Cervical back: Normal range of motion and neck supple.  Skin:    Coloration: Skin is not pale.     Findings: No rash.  Neurological:     Mental Status: He is alert and oriented to person, place, and time.  Psychiatric:        Behavior: Behavior normal.     ED Results / Procedures / Treatments   Labs (all labs ordered are listed, but only abnormal results are displayed) Labs Reviewed  COMPREHENSIVE METABOLIC PANEL - Abnormal; Notable for the following components:      Result Value   Glucose, Bld 100 (*)    Calcium 8.8 (*)    Albumin 3.4 (*)    All other components within normal limits  CBC - Abnormal; Notable for the following components:   RBC 4.09 (*)    HCT 36.1 (*)    MCHC 36.3 (*)    All other components within normal limits  URINALYSIS, ROUTINE W REFLEX MICROSCOPIC - Abnormal; Notable for the following components:   Hgb urine dipstick TRACE (*)  All other components within normal limits  URINALYSIS, MICROSCOPIC (REFLEX) - Abnormal; Notable for the following components:   Bacteria, UA RARE (*)    All other components within normal limits  GASTROINTESTINAL PANEL BY PCR, STOOL (REPLACES STOOL CULTURE)  C DIFFICILE QUICK SCREEN W PCR REFLEX    LIPASE, BLOOD  MAGNESIUM    EKG None  Radiology No results found.  Procedures Procedures    Medications Ordered in ED Medications  sodium chloride  0.9 % bolus 1,000 mL (0 mLs Intravenous Stopped 12/13/23 0925)  azithromycin  (ZITHROMAX ) tablet 500 mg (500 mg Oral Given 12/13/23 4098)    ED Course/ Medical Decision Making/ A&P                                 Medical Decision Making Amount and/or Complexity of Data Reviewed Labs: ordered.  Risk Prescription drug management.   38 yo M with a chief complaints of bloody diarrhea.  Going on for couple days.  Patient has a history of HIV, was  having some trouble getting his medicines filled on my record review.  His CD4 count was 384.  Possible infectious diarrhea.  Patient locally is well-appearing nontoxic.  Will give a bolus of IV fluids.  Blood work.  Send off stool samples if able.  LFTs and lipase are unremarkable.  No anemia.  No leukocytosis.  No leukopenia.   Start the patient on antibiotics.  ID follow-up.  9:52 AM:  I have discussed the diagnosis/risks/treatment options with the patient.  Evaluation and diagnostic testing in the emergency department does not suggest an emergent condition requiring admission or immediate intervention beyond what has been performed at this time.  They will follow up with ID. We also discussed returning to the ED immediately if new or worsening sx occur. We discussed the sx which are most concerning (e.g., sudden worsening pain, fever, inability to tolerate by mouth) that necessitate immediate return. Medications administered to the patient during their visit and any new prescriptions provided to the patient are listed below.  Medications given during this visit Medications  sodium chloride  0.9 % bolus 1,000 mL (0 mLs Intravenous Stopped 12/13/23 0925)  azithromycin  (ZITHROMAX ) tablet 500 mg (500 mg Oral Given 12/13/23 1191)     The patient appears reasonably screen and/or stabilized for discharge and I doubt any other medical condition or other [facility] requiring further screening, evaluation, or treatment in the ED at this time prior to discharge.          Final Clinical Impression(s) / ED Diagnoses Final diagnoses:  Bloody diarrhea    Rx / DC Orders ED Discharge Orders          Ordered    azithromycin  (ZITHROMAX ) 250 MG tablet  Daily        12/13/23 0918              Paul Hughs, DO 12/13/23 [id number]

## 2023-12-14 ENCOUNTER — Encounter: Payer: Self-pay | Admitting: Infectious Disease

## 2023-12-14 ENCOUNTER — Telehealth: Payer: Self-pay

## 2023-12-14 DIAGNOSIS — R197 Diarrhea, unspecified: Secondary | ICD-10-CM | POA: Insufficient documentation

## 2023-12-14 DIAGNOSIS — Z7185 Encounter for immunization safety counseling: Secondary | ICD-10-CM

## 2023-12-14 HISTORY — DX: Diarrhea, unspecified: R19.7

## 2023-12-14 HISTORY — DX: Encounter for immunization safety counseling: Z71.85

## 2023-12-14 NOTE — Telephone Encounter (Signed)
Patient called stating that he was seen at the ED yesterday due to bloody diarrhea, was given antibiotics. Patient is scheduled for follow up with Dr.Van Dam tomorrow. Just FYI.

## 2023-12-15 ENCOUNTER — Other Ambulatory Visit (HOSPITAL_COMMUNITY): Payer: Self-pay

## 2023-12-15 ENCOUNTER — Ambulatory Visit (INDEPENDENT_AMBULATORY_CARE_PROVIDER_SITE_OTHER): Payer: Self-pay | Admitting: Infectious Disease

## 2023-12-15 ENCOUNTER — Encounter: Payer: Self-pay | Admitting: Infectious Disease

## 2023-12-15 ENCOUNTER — Other Ambulatory Visit: Payer: Self-pay

## 2023-12-15 ENCOUNTER — Ambulatory Visit: Payer: Self-pay

## 2023-12-15 VITALS — BP 118/72 | HR 73 | Resp 16 | Wt 170.4 lb

## 2023-12-15 DIAGNOSIS — A5149 Other secondary syphilitic conditions: Secondary | ICD-10-CM

## 2023-12-15 DIAGNOSIS — R197 Diarrhea, unspecified: Secondary | ICD-10-CM

## 2023-12-15 DIAGNOSIS — R634 Abnormal weight loss: Secondary | ICD-10-CM

## 2023-12-15 DIAGNOSIS — K625 Hemorrhage of anus and rectum: Secondary | ICD-10-CM

## 2023-12-15 DIAGNOSIS — K921 Melena: Secondary | ICD-10-CM

## 2023-12-15 DIAGNOSIS — B20 Human immunodeficiency virus [HIV] disease: Secondary | ICD-10-CM

## 2023-12-15 DIAGNOSIS — Z7185 Encounter for immunization safety counseling: Secondary | ICD-10-CM

## 2023-12-15 DIAGNOSIS — Z113 Encounter for screening for infections with a predominantly sexual mode of transmission: Secondary | ICD-10-CM

## 2023-12-15 DIAGNOSIS — Z8619 Personal history of other infectious and parasitic diseases: Secondary | ICD-10-CM

## 2023-12-15 MED ORDER — BIKTARVY 50-200-25 MG PO TABS: 1.0000 | ORAL_TABLET | Freq: Every day | ORAL | 11 refills | Status: AC

## 2023-12-15 NOTE — Progress Notes (Signed)
Subjective:   Chief complaint: followup for HIV disease on medications, also with recent rectal bleeding   Patient ID: Paul Palmer, male    DOB: 11/26/1985, 38 y.o.   MRN: [id number]  HPI  Discussed the use of AI scribe software for clinical note transcription with the patient, who gave verbal consent to proceed.  History of Present Illness   The patient, a 38 year old with a history of HIV, presents with bloody diarrhea since last Sunday. He reports discomfort and significant blood in the stool, sometimes with no accompanying bowel movement. The patient visited the ER, where he was told it might be a bacterial infection and was prescribed azithromycin, which has not alleviated the symptoms. The patient continues to experience bleeding, discomfort, and diarrhea. He also reports decreased food intake and weight loss, but denies difficulty swallowing. The patient has been off HIV medication and is interested in participating in a study involving injectable medication for HIV.       Past Medical History:  Diagnosis Date   Bloody diarrhea 12/14/2023   HIV (human immunodeficiency virus infection) (HCC)    Vaccine counseling 12/14/2023    Past Surgical History:  Procedure Laterality Date   none      Family History  Problem Relation Age of Onset   Hypertension Mother        and grandparents   Diabetes Other        grandparents (deceased)      Social History   Socioeconomic History   Marital status: Single    Spouse name: Not on file   Number of children: Not on file   Years of education: Not on file   Highest education level: Not on file  Occupational History   Not on file  Tobacco Use   Smoking status: Every Day    Current packs/day: 0.25    Average packs/day: 0.3 packs/day for 18.3 years (4.6 ttl pk-yrs)    Types: E-cigarettes, Cigarettes    Start date: 08/22/2005   Smokeless tobacco: Never   Tobacco comments:    Patient vapes, encouraged cessation   Vaping  Use   Vaping status: Every Day  Substance and Sexual Activity   Alcohol use: Yes    Alcohol/week: 0.0 standard drinks of alcohol    Comment: occ   Drug use: Yes    Frequency: 7.0 times per week    Types: Marijuana    Comment: daily   Sexual activity: Yes    Partners: Male    Comment: declined condoms  Other Topics Concern   Not on file  Social History Narrative   Currently in monogamous relationship with another male.       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      Hobbies: tv, very busy with church activities Research officer, trade union)   Social Drivers of Corporate investment banker Strain: Not on file  Food Insecurity: Not on file  Transportation Needs: Not on file  Physical Activity: Not on file  Stress: Not on file  Social Connections: Not on file    No Known Allergies   Current Outpatient Medications:    azithromycin (ZITHROMAX) 250 MG tablet, Take 2 tablets (500 mg total) by mouth daily for 2 days., Disp: 4 tablet, Rfl: 0   bictegravir-emtricitabine-tenofovir AF (BIKTARVY) 50-200-25 MG TABS tablet, Take 1 tablet by mouth daily., Disp: 30 tablet, Rfl: 11   HYDROcodone-acetaminophen (HYCET) 7.5-325 mg/15 ml solution, Take 10 mLs by mouth every 4 (four) hours as needed for  moderate pain or severe pain. (Patient not taking: Reported on 10/15/2022), Disp: 118 mL, Rfl: 0   Review of Systems  Constitutional:  Negative for activity change, appetite change, chills, diaphoresis, fatigue, fever and unexpected weight change.  HENT:  Negative for congestion, rhinorrhea, sinus pressure, sneezing, sore throat and trouble swallowing.   Eyes:  Negative for photophobia and visual disturbance.  Respiratory:  Negative for cough, chest tightness, shortness of breath, wheezing and stridor.   Cardiovascular:  Negative for chest pain, palpitations and leg swelling.  Gastrointestinal:  Positive for abdominal pain and diarrhea. Negative for abdominal distention, blood in stool,  constipation, nausea and vomiting.  Genitourinary:  Negative for difficulty urinating, dysuria, flank pain and hematuria.  Musculoskeletal:  Negative for arthralgias, back pain, gait problem, joint swelling and myalgias.  Skin:  Negative for color change, pallor, rash and wound.  Neurological:  Negative for dizziness, tremors, weakness and light-headedness.  Hematological:  Negative for adenopathy. Does not bruise/bleed easily.  Psychiatric/Behavioral:  Negative for agitation, behavioral problems, confusion, decreased concentration, dysphoric mood and sleep disturbance.        Objective:   Physical Exam Constitutional:      Appearance: He is well-developed.  HENT:     Head: Normocephalic and atraumatic.  Eyes:     Conjunctiva/sclera: Conjunctivae normal.  Cardiovascular:     Rate and Rhythm: Normal rate and regular rhythm.  Pulmonary:     Effort: Pulmonary effort is normal. No respiratory distress.     Breath sounds: No wheezing.  Abdominal:     General: There is no distension.     Palpations: Abdomen is soft.  Musculoskeletal:        General: No tenderness. Normal range of motion.     Cervical back: Normal range of motion and neck supple.  Skin:    General: Skin is warm and dry.     Coloration: Skin is not pale.     Findings: No erythema or rash.  Neurological:     General: No focal deficit present.     Mental Status: He is alert and oriented to person, place, and time.  Psychiatric:        Mood and Affect: Mood normal.        Behavior: Behavior normal.        Thought Content: Thought content normal.        Judgment: Judgment normal.           Assessment & Plan:   Assessment and Plan    Rectal Bleeding New onset of bloody diarrhea since last Sunday. No improvement with azithromycin. No other associated symptoms reported. -Collect stool sample for GI pathogen panel.   HIV Patient has been off Biktarvy with expected detectable viral load. History of  inconsistent medication adherence. -Resume Biktarvy and provide a one-month supply, enrolling into [facility] -Discuss potential enrollment in the [facility] study for long-acting injectable antiretroviral therapy. -Perform basic labs including HIV viral load and CD4 count.   STI screening: he did not think he needed this today\  Anal cancer prevention: he is agreeable to anal pap but at next visit   Weight loss: likely from unconrolled HIV. hopefully CD4 is not low  General Health Maintenance -Confirmed patient received flu and COVID vaccinations in the fall. -Schedule follow-up appointment in one month.

## 2023-12-16 ENCOUNTER — Other Ambulatory Visit: Payer: Self-pay

## 2023-12-16 DIAGNOSIS — Z113 Encounter for screening for infections with a predominantly sexual mode of transmission: Secondary | ICD-10-CM

## 2023-12-16 DIAGNOSIS — R197 Diarrhea, unspecified: Secondary | ICD-10-CM

## 2023-12-16 NOTE — Addendum Note (Signed)
Addended by: Harley Alto on: 12/16/2023 09:22 AM   Modules accepted: Orders

## 2023-12-20 ENCOUNTER — Telehealth: Payer: Self-pay

## 2023-12-20 NOTE — Telephone Encounter (Signed)
Patient called concerned about RPR lab results. Discussed that titer is low at 1:1 which would not indicate new infection.   He mentions that since he was seen in the clinic on 2/12 that his eyes have been very red/bloodshot and itchy. States he's never experienced symptoms like this before. States it's not painful. Had been using OTC eye drops for meant for stye, but did not notice any improvement. He was wondering if he should try artificial tears or if Dr. Daiva Eves has any other recommendations. Alby does not have a PCP.   Sandie Ano, RN

## 2023-12-21 LAB — GASTROINTESTINAL PATHOGEN PNL

## 2023-12-22 ENCOUNTER — Other Ambulatory Visit: Payer: Self-pay | Admitting: Pharmacist

## 2023-12-22 DIAGNOSIS — B20 Human immunodeficiency virus [HIV] disease: Secondary | ICD-10-CM

## 2023-12-22 MED ORDER — BICTEGRAVIR-EMTRICITAB-TENOFOV 50-200-25 MG PO TABS: 1.0000 | ORAL_TABLET | Freq: Every day | ORAL | Status: AC

## 2023-12-22 NOTE — Progress Notes (Signed)
Medication Samples have been provided to the patient.  Drug name: Biktarvy        Strength: 50/200/25 mg       Qty: 14 tablets (2 bottles) LOT: CSCFVA   Exp.Date: 10/26  Dosing instructions: Take one tablet by mouth once daily  The patient has been instructed regarding the correct time, dose, and frequency of taking this medication, including desired effects and most common side effects.   Margarite Gouge, PharmD, CPP, BCIDP, AAHIVP Clinical Pharmacist Practitioner Infectious Diseases Clinical Pharmacist [facility]

## 2023-12-25 LAB — LIPID PANEL
Cholesterol: 159 mg/dL (ref <200)
HDL: 29 mg/dL — ABNORMAL LOW (ref >=40)
LDL Cholesterol (Calc): 109 mg/dL — ABNORMAL HIGH
Non-HDL Cholesterol (Calc): 130 mg/dL — ABNORMAL HIGH (ref <130)
Total CHOL/HDL Ratio: 5.5 (calc) — ABNORMAL HIGH (ref <5.0)
Triglycerides: 100 mg/dL (ref <150)

## 2023-12-25 LAB — HIV-1 INTEGRASE GENOTYPE

## 2023-12-25 LAB — HIV RNA, RTPCR W/R GT (RTI, PI,INT)
HIV 1 RNA Quant: 90600 {copies}/mL — ABNORMAL HIGH
HIV-1 RNA Quant, Log: 4.96 {Log} — ABNORMAL HIGH

## 2023-12-25 LAB — HIV-1 GENOTYPE: HIV-1 Genotype: DETECTED — AB

## 2023-12-25 LAB — RPR TITER: RPR Titer: 1:1 {titer} — ABNORMAL HIGH

## 2023-12-25 LAB — RPR: RPR Ser Ql: REACTIVE — AB

## 2023-12-25 LAB — T PALLIDUM AB: T Pallidum Abs: POSITIVE — AB

## 2024-01-17 ENCOUNTER — Telehealth: Payer: Self-pay | Admitting: Infectious Disease

## 2024-01-17 ENCOUNTER — Ambulatory Visit: Payer: Self-pay | Admitting: Infectious Disease

## 2024-01-17 NOTE — Telephone Encounter (Signed)
 Good morning   I am calling you from Dr Clinton Gallant office we need to reschedule your appointment for today could you give [place] a call at [phone]  Thank you   Claris Che

## 2024-01-20 ENCOUNTER — Ambulatory Visit (INDEPENDENT_AMBULATORY_CARE_PROVIDER_SITE_OTHER): Payer: Self-pay | Admitting: Infectious Disease

## 2024-01-20 ENCOUNTER — Other Ambulatory Visit: Payer: Self-pay

## 2024-01-20 ENCOUNTER — Encounter: Payer: Self-pay | Admitting: Infectious Disease

## 2024-01-20 VITALS — BP 123/82 | HR 69 | Temp 98.2°F | Ht 76.0 in | Wt 176.0 lb

## 2024-01-20 DIAGNOSIS — B20 Human immunodeficiency virus [HIV] disease: Secondary | ICD-10-CM

## 2024-01-20 DIAGNOSIS — Z7185 Encounter for immunization safety counseling: Secondary | ICD-10-CM

## 2024-01-20 DIAGNOSIS — B59 Pneumocystosis: Secondary | ICD-10-CM

## 2024-01-20 DIAGNOSIS — Z8619 Personal history of other infectious and parasitic diseases: Secondary | ICD-10-CM

## 2024-01-20 DIAGNOSIS — Z1212 Encounter for screening for malignant neoplasm of rectum: Secondary | ICD-10-CM

## 2024-01-20 DIAGNOSIS — Z113 Encounter for screening for infections with a predominantly sexual mode of transmission: Secondary | ICD-10-CM

## 2024-01-20 DIAGNOSIS — R197 Diarrhea, unspecified: Secondary | ICD-10-CM
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
## 2024-01-21 LAB — T-HELPER CELLS (CD4) COUNT (NOT AT ARMC)
CD4 % Helper T Cell: 23 % — ABNORMAL LOW (ref 33–65)
CD4 T Cell Abs: 457 /uL (ref 400–1790)

## 2024-01-22 LAB — COMPLETE METABOLIC PANEL WITH GFR
AG Ratio: 1.5 (calc) (ref 1.0–2.5)
ALT: 11 U/L (ref 9–46)
AST: 13 U/L (ref 10–40)
Albumin: 4.6 g/dL (ref 3.6–5.1)
Alkaline phosphatase (APISO): 56 U/L (ref 36–130)
BUN: 10 mg/dL (ref 7–25)
CO2: 29 mmol/L (ref 20–32)
Calcium: 9.5 mg/dL (ref 8.6–10.3)
Chloride: 104 mmol/L (ref 98–110)
Creat: 0.91 mg/dL (ref 0.60–1.26)
Globulin: 3 g/dL (ref 1.9–3.7)
Glucose, Bld: 86 mg/dL (ref 65–99)
Potassium: 4.1 mmol/L (ref 3.5–5.3)
Sodium: 139 mmol/L (ref 135–146)
Total Bilirubin: 0.6 mg/dL (ref 0.2–1.2)
Total Protein: 7.6 g/dL (ref 6.1–8.1)

## 2024-01-22 LAB — CBC WITH DIFFERENTIAL/PLATELET
Absolute Lymphocytes: 2172 {cells}/uL (ref 850–3900)
Absolute Monocytes: 200 {cells}/uL (ref 200–950)
Basophils Absolute: 11 {cells}/uL (ref 0–200)
Basophils Relative: 0.3 %
Eosinophils Absolute: 100 {cells}/uL (ref 15–500)
Eosinophils Relative: 2.7 %
HCT: 44.6 % (ref 38.5–50.0)
Hemoglobin: 15.2 g/dL (ref 13.2–17.1)
MCH: 32.1 pg (ref 27.0–33.0)
MCHC: 34.1 g/dL (ref 32.0–36.0)
MCV: 94.3 fL (ref 80.0–100.0)
MPV: 9.9 fL (ref 7.5–12.5)
Monocytes Relative: 5.4 %
Neutro Abs: 1217 {cells}/uL — ABNORMAL LOW (ref 1500–7800)
Neutrophils Relative %: 32.9 %
Platelets: 265 10*3/uL (ref 140–400)
RBC: 4.73 10*6/uL (ref 4.20–5.80)
RDW: 14 % (ref 11.0–15.0)
Total Lymphocyte: 58.7 %
WBC: 3.7 10*3/uL — ABNORMAL LOW (ref 3.8–10.8)

## 2024-01-22 LAB — T PALLIDUM AB: T Pallidum Abs: POSITIVE — AB

## 2024-01-22 LAB — RPR: RPR Ser Ql: REACTIVE — AB

## 2024-01-22 LAB — RPR TITER: RPR Titer: 1:1 {titer} — ABNORMAL HIGH

## 2024-01-22 LAB — HIV-1 RNA QUANT-NO REFLEX-BLD
HIV 1 RNA Quant: NOT DETECTED {copies}/mL
HIV-1 RNA Quant, Log: NOT DETECTED {Log_copies}/mL

## 2024-02-23 ENCOUNTER — Other Ambulatory Visit (HOSPITAL_COMMUNITY): Payer: Self-pay

## 2024-03-18 NOTE — Progress Notes (Deleted)
   Subjective:  Chief complaint: follow-up for HIV disease on medications   Patient ID: Paul Palmer, male    DOB: 08/18/1986, 38 y.o.   MRN: [id number]  HPI   Past Medical History:  Diagnosis Date   Bloody diarrhea 12/14/2023   HIV (human immunodeficiency virus infection) (HCC)    Vaccine counseling 12/14/2023    Past Surgical History:  Procedure Laterality Date   none      Family History  Problem Relation Age of Onset   Hypertension Mother        and grandparents   Diabetes Other        grandparents (deceased)      Social History   Socioeconomic History   Marital status: Single    Spouse name: Not on file   Number of children: Not on file   Years of education: Not on file   Highest education level: Not on file  Occupational History   Not on file  Tobacco Use   Smoking status: Every Day    Current packs/day: 0.25    Average packs/day: 0.3 packs/day for 18.6 years (4.6 ttl pk-yrs)    Types: E-cigarettes, Cigarettes    Start date: 08/22/2005   Smokeless tobacco: Never   Tobacco comments:    Patient vapes, encouraged cessation   Vaping Use   Vaping status: Every Day  Substance and Sexual Activity   Alcohol use: Yes    Alcohol/week: 0.0 standard drinks of alcohol    Comment: occ   Drug use: Yes    Frequency: 7.0 times per week    Types: Marijuana    Comment: daily   Sexual activity: Yes    Partners: Male    Comment: declined condoms  Other Topics Concern   Not on file  Social History Narrative   Currently in monogamous relationship with another male.       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

## 2024-03-20 ENCOUNTER — Ambulatory Visit: Payer: Self-pay | Admitting: Infectious Disease

## 2024-03-20 DIAGNOSIS — B20 Human immunodeficiency virus [HIV] disease: Secondary | ICD-10-CM

## 2024-03-20 DIAGNOSIS — A5149 Other secondary syphilitic conditions: Secondary | ICD-10-CM

## 2024-05-17 ENCOUNTER — Ambulatory Visit: Payer: Self-pay | Admitting: Infectious Disease

## 2024-06-13 NOTE — Progress Notes (Deleted)
   Subjective:  Chief complaint: follow-up for HIV disease on medications   Patient ID: Paul Palmer, male    DOB: [birth year], 38 y.o.   MRN: [id number]  HPI  Past Medical History:  Diagnosis Date   Bloody diarrhea 12/14/2023   HIV (human immunodeficiency virus infection) (HCC)    Vaccine counseling 12/14/2023    Past Surgical History:  Procedure Laterality Date   none      Family History  Problem Relation Age of Onset   Hypertension Mother        and grandparents   Diabetes Other        grandparents (deceased)      Social History   Socioeconomic History   Marital status: Single    Spouse name: Not on file   Number of children: Not on file   Years of education: Not on file   Highest education level: Not on file  Occupational History   Not on file  Tobacco Use   Smoking status: Every Day    Current packs/day: 0.25    Average packs/day: 0.3 packs/day for 18.8 years (4.7 ttl pk-yrs)    Types: E-cigarettes, Cigarettes    Start date: 08/22/2005   Smokeless tobacco: Never   Tobacco comments:    Patient vapes, encouraged cessation   Vaping Use   Vaping status: Every Day  Substance and Sexual Activity   Alcohol use: Yes    Alcohol/week: 0.0 standard drinks of alcohol    Comment: occ   Drug use: Yes    Frequency: 7.0 times per week    Types: Marijuana    Comment: daily   Sexual activity: Yes    Partners: Male    Comment: declined condoms  Other Topics Concern   Not on file  Social History Narrative   Currently in monogamous relationship with another male.       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

## 2024-06-14 ENCOUNTER — Ambulatory Visit: Payer: Self-pay | Admitting: Infectious Disease

## 2024-06-14 DIAGNOSIS — Z7185 Encounter for immunization safety counseling: Secondary | ICD-10-CM

## 2024-06-14 DIAGNOSIS — B20 Human immunodeficiency virus [HIV] disease: Secondary | ICD-10-CM

## 2024-06-20 ENCOUNTER — Telehealth: Payer: Self-pay

## 2024-06-20 ENCOUNTER — Ambulatory Visit: Payer: Self-pay | Admitting: Infectious Disease

## 2024-06-20 NOTE — Telephone Encounter (Signed)
 Left patient a voice mail to call back to schedule an appointment with Dr. Fleeta Rothman.
